# Patient Record
Sex: Female | Born: 1935
Health system: Southern US, Community
[De-identification: ages and names within clinical notes are randomized; demographics above are authoritative.]

## PROBLEM LIST (undated history)

## (undated) DIAGNOSIS — F0390 Unspecified dementia without behavioral disturbance: Secondary | ICD-10-CM

## (undated) DIAGNOSIS — C801 Malignant (primary) neoplasm, unspecified: Secondary | ICD-10-CM

## (undated) DIAGNOSIS — I10 Essential (primary) hypertension: Secondary | ICD-10-CM

## (undated) DIAGNOSIS — I639 Cerebral infarction, unspecified: Secondary | ICD-10-CM

## (undated) DIAGNOSIS — E7800 Pure hypercholesterolemia, unspecified: Secondary | ICD-10-CM

## (undated) DIAGNOSIS — G309 Alzheimer's disease, unspecified: Secondary | ICD-10-CM

## (undated) DIAGNOSIS — F0280 Dementia in other diseases classified elsewhere without behavioral disturbance: Secondary | ICD-10-CM

## (undated) HISTORY — PX: ABDOMINAL HYSTERECTOMY: SHX81

## (undated) HISTORY — PX: BUNIONECTOMY: SHX129

## (undated) HISTORY — PX: TUMOR REMOVAL: SHX12

## (undated) HISTORY — PX: BREAST RECONSTRUCTION: SHX9

## (undated) HISTORY — PX: MASTECTOMY: SHX3

## (undated) HISTORY — PX: EYE SURGERY: SHX253

---

## 1980-02-14 DIAGNOSIS — C801 Malignant (primary) neoplasm, unspecified: Secondary | ICD-10-CM

## 1980-02-14 HISTORY — DX: Malignant (primary) neoplasm, unspecified: C80.1

## 2013-03-31 ENCOUNTER — Ambulatory Visit: Payer: Self-pay | Admitting: Family Medicine

## 2013-05-26 ENCOUNTER — Other Ambulatory Visit (HOSPITAL_COMMUNITY): Payer: Self-pay | Admitting: Internal Medicine

## 2013-05-26 DIAGNOSIS — Z1231 Encounter for screening mammogram for malignant neoplasm of breast: Secondary | ICD-10-CM

## 2013-06-02 ENCOUNTER — Ambulatory Visit (HOSPITAL_COMMUNITY): Payer: Self-pay

## 2013-06-02 ENCOUNTER — Ambulatory Visit (HOSPITAL_COMMUNITY)
Admission: RE | Admit: 2013-06-02 | Discharge: 2013-06-02 | Disposition: A | Discharge status: Home / Self Care | Payer: Medicare Other | Source: Ambulatory Visit | Attending: Internal Medicine | Admitting: Internal Medicine

## 2013-06-02 DIAGNOSIS — Z1231 Encounter for screening mammogram for malignant neoplasm of breast: Secondary | ICD-10-CM

## 2014-10-26 ENCOUNTER — Ambulatory Visit (HOSPITAL_COMMUNITY)
Admission: RE | Admit: 2014-10-26 | Discharge: 2014-10-26 | Disposition: A | Discharge status: Home / Self Care | Payer: Medicare Other | Source: Ambulatory Visit | Attending: Internal Medicine | Admitting: Internal Medicine

## 2014-10-26 ENCOUNTER — Encounter (HOSPITAL_COMMUNITY): Payer: Self-pay

## 2014-10-26 DIAGNOSIS — M810 Age-related osteoporosis without current pathological fracture: Secondary | ICD-10-CM | POA: Insufficient documentation

## 2014-10-26 HISTORY — DX: Pure hypercholesterolemia, unspecified: E78.00

## 2014-10-26 HISTORY — DX: Malignant (primary) neoplasm, unspecified: C80.1

## 2014-10-26 HISTORY — DX: Cerebral infarction, unspecified: I63.9

## 2014-10-26 HISTORY — DX: Dementia in other diseases classified elsewhere, unspecified severity, without behavioral disturbance, psychotic disturbance, mood disturbance, and anxiety: F02.80

## 2014-10-26 HISTORY — DX: Alzheimer's disease, unspecified: G30.9

## 2014-10-26 HISTORY — DX: Unspecified dementia, unspecified severity, without behavioral disturbance, psychotic disturbance, mood disturbance, and anxiety: F03.90

## 2014-10-26 HISTORY — DX: Essential (primary) hypertension: I10

## 2014-10-26 MED ORDER — DENOSUMAB 60 MG/ML ~~LOC~~ SOLN
60.0000 mg | Freq: Once | SUBCUTANEOUS | Status: AC
  Administered 2014-10-26: 60 mg via SUBCUTANEOUS
  Filled 2014-10-26: qty 1

## 2014-10-26 NOTE — Discharge Instructions (Signed)
Denosumab injection What is this medicine? DENOSUMAB (den oh sue mab) slows bone breakdown. Prolia is used to treat osteoporosis in women after menopause and in men. Xgeva is used to prevent bone fractures and other bone problems caused by cancer bone metastases. Xgeva is also used to treat giant cell tumor of the bone. This medicine may be used for other purposes; ask your health care provider or pharmacist if you have questions. COMMON BRAND NAME(S): Prolia, XGEVA What should I tell my health care provider before I take this medicine? They need to know if you have any of these conditions: -dental disease -eczema -infection or history of infections -kidney disease or on dialysis -low blood calcium or vitamin D -malabsorption syndrome -scheduled to have surgery or tooth extraction -taking medicine that contains denosumab -thyroid or parathyroid disease -an unusual reaction to denosumab, other medicines, foods, dyes, or preservatives -pregnant or trying to get pregnant -breast-feeding How should I use this medicine? This medicine is for injection under the skin. It is given by a health care professional in a hospital or clinic setting. If you are getting Prolia, a special MedGuide will be given to you by the pharmacist with each prescription and refill. Be sure to read this information carefully each time. For Prolia, talk to your pediatrician regarding the use of this medicine in children. Special care may be needed. For Xgeva, talk to your pediatrician regarding the use of this medicine in children. While this drug may be prescribed for children as young as 13 years for selected conditions, precautions do apply. Overdosage: If you think you've taken too much of this medicine contact a poison control center or emergency room at once. Overdosage: If you think you have taken too much of this medicine contact a poison control center or emergency room at once. NOTE: This medicine is only for  you. Do not share this medicine with others. What if I miss a dose? It is important not to miss your dose. Call your doctor or health care professional if you are unable to keep an appointment. What may interact with this medicine? Do not take this medicine with any of the following medications: -other medicines containing denosumab This medicine may also interact with the following medications: -medicines that suppress the immune system -medicines that treat cancer -steroid medicines like prednisone or cortisone This list may not describe all possible interactions. Give your health care provider a list of all the medicines, herbs, non-prescription drugs, or dietary supplements you use. Also tell them if you smoke, drink alcohol, or use illegal drugs. Some items may interact with your medicine. What should I watch for while using this medicine? Visit your doctor or health care professional for regular checks on your progress. Your doctor or health care professional may order blood tests and other tests to see how you are doing. Call your doctor or health care professional if you get a cold or other infection while receiving this medicine. Do not treat yourself. This medicine may decrease your body's ability to fight infection. You should make sure you get enough calcium and vitamin D while you are taking this medicine, unless your doctor tells you not to. Discuss the foods you eat and the vitamins you take with your health care professional. See your dentist regularly. Brush and floss your teeth as directed. Before you have any dental work done, tell your dentist you are receiving this medicine. Do not become pregnant while taking this medicine or for 5 months after stopping   it. Women should inform their doctor if they wish to become pregnant or think they might be pregnant. There is a potential for serious side effects to an unborn child. Talk to your health care professional or pharmacist for more  information. What side effects may I notice from receiving this medicine? Side effects that you should report to your doctor or health care professional as soon as possible: -allergic reactions like skin rash, itching or hives, swelling of the face, lips, or tongue -breathing problems -chest pain -fast, irregular heartbeat -feeling faint or lightheaded, falls -fever, chills, or any other sign of infection -muscle spasms, tightening, or twitches -numbness or tingling -skin blisters or bumps, or is dry, peels, or red -slow healing or unexplained pain in the mouth or jaw -unusual bleeding or bruising Side effects that usually do not require medical attention (Report these to your doctor or health care professional if they continue or are bothersome.): -muscle pain -stomach upset, gas This list may not describe all possible side effects. Call your doctor for medical advice about side effects. You may report side effects to FDA at 1-800-FDA-1088. Where should I keep my medicine? This medicine is only given in a clinic, doctor's office, or other health care setting and will not be stored at home. NOTE: This sheet is a summary. It may not cover all possible information. If you have questions about this medicine, talk to your doctor, pharmacist, or health care provider.  2015, Elsevier/Gold Standard. (2011-07-31 12:37:47)  

## 2014-12-28 DIAGNOSIS — E782 Mixed hyperlipidemia: Secondary | ICD-10-CM | POA: Diagnosis not present

## 2014-12-28 DIAGNOSIS — I10 Essential (primary) hypertension: Secondary | ICD-10-CM | POA: Diagnosis not present

## 2015-01-04 DIAGNOSIS — G309 Alzheimer's disease, unspecified: Secondary | ICD-10-CM | POA: Diagnosis not present

## 2015-01-04 DIAGNOSIS — E782 Mixed hyperlipidemia: Secondary | ICD-10-CM | POA: Diagnosis not present

## 2015-01-04 DIAGNOSIS — K649 Unspecified hemorrhoids: Secondary | ICD-10-CM | POA: Diagnosis not present

## 2015-01-04 DIAGNOSIS — I10 Essential (primary) hypertension: Secondary | ICD-10-CM | POA: Diagnosis not present

## 2015-04-26 ENCOUNTER — Ambulatory Visit (HOSPITAL_COMMUNITY): Payer: Self-pay

## 2015-06-29 DIAGNOSIS — E782 Mixed hyperlipidemia: Secondary | ICD-10-CM | POA: Diagnosis not present

## 2015-07-05 DIAGNOSIS — I10 Essential (primary) hypertension: Secondary | ICD-10-CM | POA: Diagnosis not present

## 2015-07-05 DIAGNOSIS — E782 Mixed hyperlipidemia: Secondary | ICD-10-CM | POA: Diagnosis not present

## 2016-07-03 DIAGNOSIS — E782 Mixed hyperlipidemia: Secondary | ICD-10-CM | POA: Diagnosis not present

## 2016-07-04 DIAGNOSIS — E782 Mixed hyperlipidemia: Secondary | ICD-10-CM | POA: Diagnosis not present

## 2016-07-04 DIAGNOSIS — Z0000 Encounter for general adult medical examination without abnormal findings: Secondary | ICD-10-CM | POA: Diagnosis not present

## 2016-07-04 DIAGNOSIS — G309 Alzheimer's disease, unspecified: Secondary | ICD-10-CM | POA: Diagnosis not present

## 2016-07-04 DIAGNOSIS — I10 Essential (primary) hypertension: Secondary | ICD-10-CM | POA: Diagnosis not present

## 2017-05-28 ENCOUNTER — Inpatient Hospital Stay (HOSPITAL_COMMUNITY)
Admission: EM | Admit: 2017-05-28 | Discharge: 2017-05-30 | DRG: 641 | Disposition: A | Discharge status: Hospice | Payer: Medicare Other | Attending: Family Medicine | Admitting: Family Medicine

## 2017-05-28 ENCOUNTER — Emergency Department (HOSPITAL_COMMUNITY): Payer: Medicare Other

## 2017-05-28 ENCOUNTER — Other Ambulatory Visit: Payer: Self-pay

## 2017-05-28 ENCOUNTER — Encounter (HOSPITAL_COMMUNITY): Payer: Self-pay | Admitting: Emergency Medicine

## 2017-05-28 DIAGNOSIS — R531 Weakness: Secondary | ICD-10-CM | POA: Diagnosis not present

## 2017-05-28 DIAGNOSIS — Z79899 Other long term (current) drug therapy: Secondary | ICD-10-CM | POA: Diagnosis not present

## 2017-05-28 DIAGNOSIS — R627 Adult failure to thrive: Secondary | ICD-10-CM | POA: Diagnosis not present

## 2017-05-28 DIAGNOSIS — Z9010 Acquired absence of unspecified breast and nipple: Secondary | ICD-10-CM | POA: Diagnosis not present

## 2017-05-28 DIAGNOSIS — G8191 Hemiplegia, unspecified affecting right dominant side: Secondary | ICD-10-CM | POA: Diagnosis not present

## 2017-05-28 DIAGNOSIS — I69391 Dysphagia following cerebral infarction: Secondary | ICD-10-CM

## 2017-05-28 DIAGNOSIS — I10 Essential (primary) hypertension: Secondary | ICD-10-CM | POA: Diagnosis not present

## 2017-05-28 DIAGNOSIS — Z7982 Long term (current) use of aspirin: Secondary | ICD-10-CM

## 2017-05-28 DIAGNOSIS — Z90710 Acquired absence of both cervix and uterus: Secondary | ICD-10-CM | POA: Diagnosis not present

## 2017-05-28 DIAGNOSIS — I69351 Hemiplegia and hemiparesis following cerebral infarction affecting right dominant side: Secondary | ICD-10-CM | POA: Diagnosis not present

## 2017-05-28 DIAGNOSIS — Z853 Personal history of malignant neoplasm of breast: Secondary | ICD-10-CM | POA: Diagnosis not present

## 2017-05-28 DIAGNOSIS — Z8673 Personal history of transient ischemic attack (TIA), and cerebral infarction without residual deficits: Secondary | ICD-10-CM

## 2017-05-28 DIAGNOSIS — R404 Transient alteration of awareness: Secondary | ICD-10-CM | POA: Diagnosis not present

## 2017-05-28 DIAGNOSIS — R1310 Dysphagia, unspecified: Secondary | ICD-10-CM | POA: Diagnosis present

## 2017-05-28 DIAGNOSIS — Z515 Encounter for palliative care: Secondary | ICD-10-CM | POA: Diagnosis present

## 2017-05-28 DIAGNOSIS — Z9841 Cataract extraction status, right eye: Secondary | ICD-10-CM | POA: Diagnosis not present

## 2017-05-28 DIAGNOSIS — Z66 Do not resuscitate: Secondary | ICD-10-CM | POA: Diagnosis not present

## 2017-05-28 DIAGNOSIS — E870 Hyperosmolality and hypernatremia: Principal | ICD-10-CM | POA: Diagnosis present

## 2017-05-28 DIAGNOSIS — Z888 Allergy status to other drugs, medicaments and biological substances status: Secondary | ICD-10-CM | POA: Diagnosis not present

## 2017-05-28 DIAGNOSIS — G309 Alzheimer's disease, unspecified: Secondary | ICD-10-CM | POA: Diagnosis not present

## 2017-05-28 DIAGNOSIS — Z87891 Personal history of nicotine dependence: Secondary | ICD-10-CM | POA: Diagnosis not present

## 2017-05-28 DIAGNOSIS — E860 Dehydration: Secondary | ICD-10-CM | POA: Diagnosis not present

## 2017-05-28 DIAGNOSIS — Z7189 Other specified counseling: Secondary | ICD-10-CM

## 2017-05-28 DIAGNOSIS — E7800 Pure hypercholesterolemia, unspecified: Secondary | ICD-10-CM | POA: Diagnosis present

## 2017-05-28 DIAGNOSIS — Z9842 Cataract extraction status, left eye: Secondary | ICD-10-CM | POA: Diagnosis not present

## 2017-05-28 DIAGNOSIS — Z9011 Acquired absence of right breast and nipple: Secondary | ICD-10-CM | POA: Diagnosis not present

## 2017-05-28 DIAGNOSIS — F0280 Dementia in other diseases classified elsewhere without behavioral disturbance: Secondary | ICD-10-CM | POA: Diagnosis present

## 2017-05-28 LAB — URINALYSIS, ROUTINE W REFLEX MICROSCOPIC
BILIRUBIN URINE: NEGATIVE
Glucose, UA: NEGATIVE mg/dL
KETONES UR: 20 mg/dL — AB
Leukocytes, UA: NEGATIVE
Nitrite: NEGATIVE
Protein, ur: NEGATIVE mg/dL
SPECIFIC GRAVITY, URINE: 1.024 (ref 1.005–1.030)
pH: 5 (ref 5.0–8.0)

## 2017-05-28 LAB — CBC WITH DIFFERENTIAL/PLATELET
BASOS ABS: 0 10*3/uL (ref 0.0–0.1)
Basophils Relative: 0 %
Eosinophils Absolute: 0 10*3/uL (ref 0.0–0.7)
Eosinophils Relative: 0 %
HEMATOCRIT: 50.7 % — AB (ref 36.0–46.0)
HEMOGLOBIN: 14.8 g/dL (ref 12.0–15.0)
LYMPHS PCT: 22 %
Lymphs Abs: 1.8 10*3/uL (ref 0.7–4.0)
MCH: 30 pg (ref 26.0–34.0)
MCHC: 29.2 g/dL — ABNORMAL LOW (ref 30.0–36.0)
MCV: 102.6 fL — AB (ref 78.0–100.0)
Monocytes Absolute: 0.6 10*3/uL (ref 0.1–1.0)
Monocytes Relative: 7 %
NEUTROS ABS: 5.8 10*3/uL (ref 1.7–7.7)
Neutrophils Relative %: 71 %
Platelets: 148 10*3/uL — ABNORMAL LOW (ref 150–400)
RBC: 4.94 MIL/uL (ref 3.87–5.11)
RDW: 15.3 % (ref 11.5–15.5)
WBC: 8.2 10*3/uL (ref 4.0–10.5)

## 2017-05-28 LAB — COMPREHENSIVE METABOLIC PANEL
ALK PHOS: 92 U/L (ref 38–126)
ALT: 53 U/L (ref 14–54)
AST: 48 U/L — AB (ref 15–41)
Albumin: 3.6 g/dL (ref 3.5–5.0)
Anion gap: 11 (ref 5–15)
BILIRUBIN TOTAL: 0.8 mg/dL (ref 0.3–1.2)
BUN: 50 mg/dL — ABNORMAL HIGH (ref 6–20)
CHLORIDE: 129 mmol/L — AB (ref 101–111)
CO2: 26 mmol/L (ref 22–32)
CREATININE: 1.12 mg/dL — AB (ref 0.44–1.00)
Calcium: 9.4 mg/dL (ref 8.9–10.3)
GFR calc Af Amer: 52 mL/min — ABNORMAL LOW (ref >60)
GFR, EST NON AFRICAN AMERICAN: 45 mL/min — AB (ref >60)
Glucose, Bld: 111 mg/dL — ABNORMAL HIGH (ref 65–99)
Potassium: 3.6 mmol/L (ref 3.5–5.1)
Sodium: 166 mmol/L (ref 135–145)
Total Protein: 7.3 g/dL (ref 6.5–8.1)

## 2017-05-28 LAB — TROPONIN I

## 2017-05-28 MED ORDER — MORPHINE SULFATE (CONCENTRATE) 10 MG/0.5ML PO SOLN: 5.0000 mg | ORAL | Status: DC | PRN

## 2017-05-28 MED ORDER — LORAZEPAM 2 MG/ML IJ SOLN: 1.0000 mg | INTRAMUSCULAR | Status: DC | PRN

## 2017-05-28 MED ORDER — SODIUM CHLORIDE 0.9% FLUSH: 3.0000 mL | INTRAVENOUS | Status: DC | PRN

## 2017-05-28 MED ORDER — LORAZEPAM 2 MG/ML PO CONC: 1.0000 mg | ORAL | Status: DC | PRN

## 2017-05-28 MED ORDER — SODIUM CHLORIDE 0.9% FLUSH
3.0000 mL | Freq: Two times a day (BID) | INTRAVENOUS | Status: DC
  Administered 2017-05-28: 3 mL via INTRAVENOUS

## 2017-05-28 MED ORDER — GLYCOPYRROLATE 0.2 MG/ML IJ SOLN: 0.2000 mg | INTRAMUSCULAR | Status: DC | PRN

## 2017-05-28 MED ORDER — ACETAMINOPHEN 325 MG PO TABS: 650.0000 mg | ORAL_TABLET | Freq: Four times a day (QID) | ORAL | Status: DC | PRN

## 2017-05-28 MED ORDER — LORAZEPAM 1 MG PO TABS: 1.0000 mg | ORAL_TABLET | ORAL | Status: DC | PRN

## 2017-05-28 MED ORDER — HALOPERIDOL LACTATE 5 MG/ML IJ SOLN: 0.5000 mg | INTRAMUSCULAR | Status: DC | PRN

## 2017-05-28 MED ORDER — POLYVINYL ALCOHOL 1.4 % OP SOLN: 1.0000 [drp] | Freq: Four times a day (QID) | OPHTHALMIC | Status: DC | PRN

## 2017-05-28 MED ORDER — HALOPERIDOL 0.5 MG PO TABS: 0.5000 mg | ORAL_TABLET | ORAL | Status: DC | PRN

## 2017-05-28 MED ORDER — SODIUM CHLORIDE 0.9 % IV SOLN
INTRAVENOUS | Status: DC
  Administered 2017-05-28: 16:00:00 via INTRAVENOUS

## 2017-05-28 MED ORDER — ACETAMINOPHEN 650 MG RE SUPP: 650.0000 mg | Freq: Four times a day (QID) | RECTAL | Status: DC | PRN

## 2017-05-28 MED ORDER — GLYCOPYRROLATE 1 MG PO TABS: 1.0000 mg | ORAL_TABLET | ORAL | Status: DC | PRN

## 2017-05-28 MED ORDER — HALOPERIDOL LACTATE 2 MG/ML PO CONC
0.5000 mg | ORAL | Status: DC | PRN
  Filled 2017-05-28: qty 0.3

## 2017-05-28 MED ORDER — BIOTENE DRY MOUTH MT LIQD: 15.0000 mL | OROMUCOSAL | Status: DC | PRN

## 2017-05-28 MED ORDER — ONDANSETRON HCL 4 MG/2ML IJ SOLN: 4.0000 mg | Freq: Four times a day (QID) | INTRAMUSCULAR | Status: DC | PRN

## 2017-05-28 MED ORDER — ONDANSETRON 4 MG PO TBDP: 4.0000 mg | ORAL_TABLET | Freq: Four times a day (QID) | ORAL | Status: DC | PRN

## 2017-05-28 MED ORDER — SODIUM CHLORIDE 0.9 % IV SOLN: 250.0000 mL | INTRAVENOUS | Status: DC | PRN

## 2017-05-28 MED ORDER — ORAL CARE MOUTH RINSE
15.0000 mL | Freq: Two times a day (BID) | OROMUCOSAL | Status: DC
  Administered 2017-05-28 – 2017-05-29 (×2): 15 mL via OROMUCOSAL

## 2017-05-28 NOTE — ED Provider Notes (Signed)
[facility] EMERGENCY DEPARTMENT Provider Note   CSN: [id number] Arrival date & time: 05/28/17  1317     History   Chief Complaint Chief Complaint  Patient presents with  . Weakness    HPI Mary Dickson is a 84 y.o. female.  HPI  The patient is an 84 year old female, she has a history of Alzheimer's disease and dementia for which she is unable to give [place] much in the way of information.  She presents with altered mental status further complicating her inability to give [place] any information.  Level 5 caveat applies.  Per the paramedics who are the only historians at this time the patient is coming from home where she lives with family with worsening generalized fatigue and weakness over the last 3 weeks with some confusion.  There is no other family members to corroborate the story at this time.  In the prehospital setting they reported a normal blood sugar at 114.  Past Medical History:  Diagnosis Date  . Alzheimer disease   . Cancer [facility]) 1982   right breast  . Dementia   . High cholesterol   . Hypertension   . Stroke [facility])     There are no active problems to display for this patient.   Past Surgical History:  Procedure Laterality Date  . ABDOMINAL HYSTERECTOMY     TOTAL  . BREAST RECONSTRUCTION Right   . BUNIONECTOMY Right   . EYE SURGERY Bilateral    CATARACT REMOVALS  . MASTECTOMY Right   . TUMOR REMOVAL     LOWER BACK     OB History   None      Home Medications    Prior to Admission medications   Medication Sig Start Date End Date Taking? Authorizing Provider  acetaminophen (TYLENOL) 500 MG tablet Take 500 mg by mouth daily.    [provider]  aspirin 81 MG tablet Take 81 mg by mouth.    [provider]  atorvastatin (LIPITOR) 40 MG tablet Take 40 mg by mouth daily.    [provider]  Calcium Carbonate-Vitamin D (CALCIUM 600-D) 600-400 MG-UNIT per tablet Take 1 tablet by mouth daily.    [provider]    cholecalciferol (VITAMIN D) 1000 UNITS tablet Take 1,000 Units by mouth daily.    [provider]  denosumab (PROLIA) 60 MG/ML SOLN injection Inject 60 mg into the skin every 6 (six) months. Administer in upper arm, thigh, or abdomen    [provider]  donepezil (ARICEPT) 10 MG tablet Take 10 mg by mouth at bedtime.    [provider]  lisinopril (PRINIVIL,ZESTRIL) 20 MG tablet Take 20 mg by mouth daily.    [provider]  loratadine (CLARITIN) 10 MG tablet Take 10 mg by mouth daily.    [provider]  memantine (NAMENDA XR) 28 MG CP24 24 hr capsule Take 28 mg by mouth daily.    [provider]    Family History No family history on file.  Social History Social History   Tobacco Use  . Smoking status: Former Research scientist (life sciences)  . Smokeless tobacco: Never Used  Substance Use Topics  . Alcohol use: No  . Drug use: No     Allergies   Gabapentin   Review of Systems Review of Systems  Unable to perform ROS: Dementia     Physical Exam Updated Vital Signs BP (!) 147/99 (BP Location: Left Arm)   Pulse 86   Temp (!) 97.5 F (36.4 C) (  Oral)   Resp 16   Ht 5\' 6"  (1.676 m)   Wt 40.4 kg (89 lb)   SpO2 94%   BMI 14.36 kg/m   Physical Exam  Constitutional: She appears well-developed and well-nourished. No distress.  HENT:  Head: Normocephalic and atraumatic.  Mouth/Throat: No oropharyngeal exudate.  Dry mucous membranes present  Eyes: Pupils are equal, round, and reactive to light. Conjunctivae and EOM are normal. Right eye exhibits no discharge. Left eye exhibits no discharge. No scleral icterus.  Neck: Normal range of motion. Neck supple. No JVD present. No thyromegaly present.  Cardiovascular: Normal rate, regular rhythm, normal heart sounds and intact distal pulses. Exam reveals no gallop and no friction rub.  No murmur heard. Pulmonary/Chest: Effort normal and breath sounds normal. No respiratory distress. She has no  wheezes. She has no rales.  Abdominal: Soft. Bowel sounds are normal. She exhibits no distension and no mass. There is no tenderness.  Musculoskeletal: Normal range of motion. She exhibits no edema or tenderness.  Lymphadenopathy:    She has no cervical adenopathy.  Neurological: She is alert. Coordination normal.  The patient is able to lift her left leg, grip with her left hand, she is able to open and close her eyes but does not answer questions when asked.  She has right-sided facial droop, right arm flaccid paralysis and right leg generalized weakness as well.  Skin: Skin is warm and dry. No rash noted. No erythema.  Psychiatric: She has a normal mood and affect. Her behavior is normal.  Nursing note and vitals reviewed.    ED Treatments / Results  Labs (all labs ordered are listed, but only abnormal results are displayed) Labs Reviewed  URINE CULTURE  CBC WITH DIFFERENTIAL/PLATELET  COMPREHENSIVE METABOLIC PANEL  URINALYSIS, ROUTINE W REFLEX MICROSCOPIC  TROPONIN I    EKG EKG Interpretation  Date/Time:  Monday May 28 2017 13:24:55 EDT Ventricular Rate:  86 PR Interval:    QRS Duration: 115 QT Interval:  402 QTC Calculation: 481 R Axis:   -65 Text Interpretation:  Sinus rhythm Incomplete left bundle branch block LVH with secondary repolarization abnormality No old tracing to compare Abnormal ekg Confirmed by Noemi Chapel [phone]) on 05/28/2017 1:34:22 PM   Radiology No results found.  Procedures .Critical Care Performed by: Noemi Chapel, MD Authorized by: Noemi Chapel, MD   Critical care provider statement:    Critical care time (minutes):  35   Critical care time was exclusive of:  Separately billable procedures and treating other patients and teaching time   Critical care was necessary to treat or prevent imminent or life-threatening deterioration of the following conditions:  Dehydration (electrolyte abnormalities)   Critical care was time spent personally  by me on the following activities:  Blood draw for specimens, development of treatment plan with patient or surrogate, discussions with consultants, evaluation of patient's response to treatment, examination of patient, obtaining history from patient or surrogate, ordering and performing treatments and interventions, ordering and review of laboratory studies, ordering and review of radiographic studies, pulse oximetry, re-evaluation of patient's condition and review of old charts   (including critical care time)  Medications Ordered in ED Medications - No data to display   Initial Impression / Assessment and Plan / ED Course  I have reviewed the triage vital signs and the nursing notes.  Pertinent labs & imaging results that were available during my care of the patient were reviewed by me and considered in my medical decision making (see  chart for details).  Clinical Course as of May 29 1534  Mon May 28, 2017  1455 Sodium(!!): 166 [BM]  1455 Creatinine(!): 1.12 [BM]  1455 BUN(!): 50 [BM]  1455 The patient has hyperchloremic and hypernatremic consistent with a dehydrated state.  The urinalysis does show ketones as well as a high specific gravity, no obvious infection.  The patient will be given some IV fluids, she will need to be admitted to the hospital.  I do not see any signs of infection at this time.  I have personally viewed her chest x-ray and find no signs of infiltrate.  Chloride(!): 129 [BM]    Clinical Course User Index [BM] Noemi Chapel, MD   The patient's exam is not suggestive of any focal process.  There is no family to corroborate story or to give her baseline.  Will pursue a generalized weakness workup with electrolytes to make sure she is not hyponatremic, hypokalemic, hypocalcemic, dehydrated have a urinary tract infection or a new stroke.  This will require blood work chest x-ray EKG and a CT scan of the brain.  EKG does show a left bundle branch block with left  ventricular hypertrophy.  There is no old EKGs to compare this to.  Labs confirm that the patient does not fact have severe hypernatremia, she will need IV fluids, gradually bring this down.  This explains her near obtunded condition.  She does not appear to be hemodynamically unstable.  I discussed the care with the hospitalist who will admit to the hospital.  [place] provided for severe Hypernatremia  Final Clinical Impressions(s) / ED Diagnoses   Final diagnoses:  Hypernatremia  Dehydration      Noemi Chapel, MD 05/28/17 1537

## 2017-05-28 NOTE — ED Notes (Signed)
CRITICAL VALUE ALERT  Critical Value na 166  Date & Time Notied:  05/28/17 1421  Provider Notified: Dr Sabra Heck  Orders Received/Actions taken: o

## 2017-05-28 NOTE — H&P (Signed)
History and Physical    Mary Dickson DDU:[id number] DOB: 07-Dec-1933 DOA: 05/28/2017  PCP: Celene Squibb, MD Patient coming from: Home  I have personally briefly reviewed patient's old medical records in [place]  Chief Complaint: Failure to thrive  HPI: Mary Dickson is a 84 y.o. female with medical history significant of Alzheimer's dementia, previous stroke with residual right-sided hemiplegia and dysphagia, lives at home with her daughter.  Her daughter reports that for the past few weeks, she is noted that the patient has been eating less.  She often refuses to eat.  She has been becoming increasingly weak.  She is sleeping more.  She is less interactive.  She is not had any fever, shortness of breath, vomiting or diarrhea.  She does occasionally cough after eating but this is not a new problem.  She normally is able to assist with transfers from bed to wheelchair, but lately she has become "dead weight".  ED Course: When evaluated in the emergency room, vitals were noted to be stable, she was found to have a serum sodium of 166, chloride of 129, BUN of 50, she appeared to be clinically dehydrated.  Urinalysis did not show any signs of infection.  Chest x-ray did not show any signs of pneumonia.  CT scan of the head was also unremarkable.  She is been referred for admission.  Review of Systems: As per HPI otherwise 10 point review of systems negative.    Past Medical History:  Diagnosis Date  . Alzheimer disease   . Cancer [facility]) 1982   right breast  . Dementia   . High cholesterol   . Hypertension   . Stroke [facility])     Past Surgical History:  Procedure Laterality Date  . ABDOMINAL HYSTERECTOMY     TOTAL  . BREAST RECONSTRUCTION Right   . BUNIONECTOMY Right   . EYE SURGERY Bilateral    CATARACT REMOVALS  . MASTECTOMY Right   . TUMOR REMOVAL     LOWER BACK     reports that she has quit smoking. She has never used smokeless tobacco. She reports that she does not drink  alcohol or use drugs.  Allergies  Allergen Reactions  . Gabapentin Other (See Comments)    Patient's daughter states delirium     Family history: Family history reviewed and not pertinent  Prior to Admission medications   Medication Sig Start Date End Date Taking? Authorizing Provider  acetaminophen (TYLENOL) 500 MG tablet Take 500 mg by mouth daily.   Yes [provider]  aspirin 81 MG tablet Take 81 mg by mouth.   Yes [provider]  Calcium Carbonate-Vitamin D (CALCIUM 600-D) 600-400 MG-UNIT per tablet Take 1 tablet by mouth daily.   Yes [provider]  cetirizine (ZYRTEC) 10 MG tablet Take 10 mg by mouth daily.   Yes [provider]  cholecalciferol (VITAMIN D) 1000 UNITS tablet Take 1,000 Units by mouth daily.   Yes [provider]  donepezil (ARICEPT) 10 MG tablet Take 10 mg by mouth at bedtime.    [provider]  memantine (NAMENDA XR) 28 MG CP24 24 hr capsule Take 28 mg by mouth daily.    [provider]    Physical Exam: Vitals:   05/28/17 1321 05/28/17 1400 05/28/17 1530 05/28/17 1647  BP: (!) 147/99 132/81 126/90 135/84  Pulse: 86 83 88 85  Resp: 16 17 20 16   Temp: (!) 97.5 F (36.4 C)   (!) 97.4 F (  36.3 C)  TempSrc: Oral   Oral  SpO2: 94% 92% 92% 96%  Weight: 40.4 kg (89 lb)   43.8 kg (96 lb 9 oz)  Height: 5\' 6"  (1.676 m)   5\' 6"  (1.676 m)    Constitutional: NAD, calm, comfortable Vitals:   05/28/17 1321 05/28/17 1400 05/28/17 1530 05/28/17 1647  BP: (!) 147/99 132/81 126/90 135/84  Pulse: 86 83 88 85  Resp: 16 17 20 16   Temp: (!) 97.5 F (36.4 C)   (!) 97.4 F (36.3 C)  TempSrc: Oral   Oral  SpO2: 94% 92% 92% 96%  Weight: 40.4 kg (89 lb)   43.8 kg (96 lb 9 oz)  Height: 5\' 6"  (1.676 m)   5\' 6"  (1.676 m)   Eyes: PERRL, lids and conjunctivae normal ENMT: Mucous membranes are dry. Posterior pharynx clear of any exudate or lesions.Normal dentition.  Neck: normal, supple, no masses, no  thyromegaly Respiratory: clear to auscultation bilaterally, no wheezing, no crackles. Normal respiratory effort. No accessory muscle use.  Cardiovascular: Regular rate and rhythm, no murmurs / rubs / gallops. No extremity edema. 2+ pedal pulses. No carotid bruits.  Abdomen: no tenderness, no masses palpated. No hepatosplenomegaly. Bowel sounds positive.  Musculoskeletal: no clubbing / cyanosis. No joint deformity upper and lower extremities. Good ROM, no contractures. Normal muscle tone.  Skin: no rashes, lesions, ulcers. No induration Neurologic: Limited exam due to patient's mental status.  Right-sided hemiplegia present. Psychiatric: Patient is nonverbal    Labs on Admission: I have personally reviewed following labs and imaging studies  CBC: Recent Labs  Lab 05/28/17 1337  WBC 8.2  NEUTROABS 5.8  HGB 14.8  HCT 50.7*  MCV 102.6*  PLT 283*   Basic Metabolic Panel: Recent Labs  Lab 05/28/17 1337  NA 166*  K 3.6  CL 129*  CO2 26  GLUCOSE 111*  BUN 50*  CREATININE 1.12*  CALCIUM 9.4   GFR: Estimated Creatinine Clearance: 27.2 mL/min (A) (by C-G formula based on SCr of 1.12 mg/dL (H)). Liver Function Tests: Recent Labs  Lab 05/28/17 1337  AST 48*  ALT 53  ALKPHOS 92  BILITOT 0.8  PROT 7.3  ALBUMIN 3.6   No results for input(s): LIPASE, AMYLASE in the last 168 hours. No results for input(s): AMMONIA in the last 168 hours. Coagulation Profile: No results for input(s): INR, PROTIME in the last 168 hours. Cardiac Enzymes: Recent Labs  Lab 05/28/17 1337  TROPONINI <0.03   BNP (last 3 results) No results for input(s): PROBNP in the last 8760 hours. HbA1C: No results for input(s): HGBA1C in the last 72 hours. CBG: No results for input(s): GLUCAP in the last 168 hours. Lipid Profile: No results for input(s): CHOL, HDL, LDLCALC, TRIG, CHOLHDL, LDLDIRECT in the last 72 hours. Thyroid Function Tests: No results for input(s): TSH, T4TOTAL, FREET4, T3FREE,  THYROIDAB in the last 72 hours. Anemia Panel: No results for input(s): VITAMINB12, FOLATE, FERRITIN, TIBC, IRON, RETICCTPCT in the last 72 hours. Urine analysis:    Component Value Date/Time   COLORURINE YELLOW 05/28/2017 1352   APPEARANCEUR HAZY (A) 05/28/2017 1352   LABSPEC 1.024 05/28/2017 1352   PHURINE 5.0 05/28/2017 1352   GLUCOSEU NEGATIVE 05/28/2017 1352   HGBUR MODERATE (A) 05/28/2017 1352   BILIRUBINUR NEGATIVE 05/28/2017 1352   KETONESUR 20 (A) 05/28/2017 1352   PROTEINUR NEGATIVE 05/28/2017 1352   NITRITE NEGATIVE 05/28/2017 1352   LEUKOCYTESUR NEGATIVE 05/28/2017 1352    Radiological Exams on Admission: Ct Head Wo Contrast  Result Date: 05/28/2017 CLINICAL DATA:  84 year old female with generalized weakness and decreased appetite for the past 3 weeks. Prior stroke with residual right-sided weakness. Initial encounter. EXAM: CT HEAD WITHOUT CONTRAST TECHNIQUE: Contiguous axial images were obtained from the base of the skull through the vertex without intravenous contrast. COMPARISON:  None. FINDINGS: Brain: No intracranial hemorrhage or CT evidence of large acute infarct. Remote infarct posterior left internal capsule, right external capsule and inferior right cerebellum. Prominent chronic microvascular changes. Global atrophy. No intracranial mass lesion noted on this unenhanced exam. Vascular: Vascular calcifications Skull: No acute abnormality. Sinuses/Orbits: Visualized orbital structures without acute abnormality. Visualized paranasal sinuses clear. Other: Mastoid air cells and middle ear cavities are clear. IMPRESSION: No intracranial hemorrhage or CT evidence of large acute infarct. Remote infarct posterior left internal capsule, right external capsule and inferior right cerebellum. Significant chronic microvascular changes. Atrophy. Electronically Signed   By: Genia Del M.D.   On: 05/28/2017 16:28   Dg Chest Port 1 View  Result Date: 05/28/2017 CLINICAL DATA:   Altered mental status, weakness EXAM: PORTABLE CHEST 1 VIEW COMPARISON:  None. FINDINGS: The heart size and mediastinal contours are within normal limits. Both lungs are clear. The visualized skeletal structures are unremarkable. IMPRESSION: No active disease. Electronically Signed   By: Franchot Gallo M.D.   On: 05/28/2017 14:03    EKG: Independently reviewed.  Sinus rhythm with incomplete left bundle  Assessment/Plan Active Problems:   Hypernatremia   Failure to thrive in adult   History of stroke   Right hemiplegia (HCC)   Dysphagia as late effect of stroke   Alzheimer's dementia   Dehydration    1. Hyponatremia.  Related to dehydration. 2. Advanced Alzheimer's dementia with progression leading to decreased p.o. intake and failure to thrive.  Patient's p.o. intake has been poor for several weeks now.  Her daughter has noted a rapid decline over the past few weeks.  Patient has not wanted to eat or drink anything.  I suspect that the patient is in the drying process.  I have discussed this with her daughter who seems to understand.  I have recommended hospice services and the daughter is in agreement with this.  She is unsure whether she wants the patient to return home or go to residential hospice.  She would like some more information on the services.  We will consult palliative care to discuss this further with the daughter and her husband.  I have advised against IV fluids and daughter agrees.  Will focus her care on symptom management, comfort and dignity. 3. Previous stroke with residual right-sided hemiplegia.  DVT prophylaxis: No DVT prophylaxis, comfort measures only Code Status: DNR Family Communication: Discussed with daughter at the bedside Disposition Plan: Family deciding if they wish patient to go to residential hospice Consults called: Palliative care Admission status: inpatient, medsurg   Kathie Dike MD [facility] Pager [phone]  If 7PM-7AM, please  contact night-coverage [email or web address] Password [facility]  05/28/2017, 6:39 PM

## 2017-05-28 NOTE — ED Triage Notes (Signed)
Generalized weakness and decreased appetite x 3 weeks.  cbg 114.  Pt is awake and alert but confused which is her normal.   Hx of stroke with rt sided weakness.

## 2017-05-29 ENCOUNTER — Encounter (HOSPITAL_COMMUNITY): Payer: Self-pay | Admitting: Primary Care

## 2017-05-29 DIAGNOSIS — G309 Alzheimer's disease, unspecified: Secondary | ICD-10-CM

## 2017-05-29 DIAGNOSIS — R627 Adult failure to thrive: Secondary | ICD-10-CM

## 2017-05-29 DIAGNOSIS — Z515 Encounter for palliative care: Secondary | ICD-10-CM

## 2017-05-29 DIAGNOSIS — F0280 Dementia in other diseases classified elsewhere without behavioral disturbance: Secondary | ICD-10-CM

## 2017-05-29 DIAGNOSIS — E870 Hyperosmolality and hypernatremia: Principal | ICD-10-CM

## 2017-05-29 DIAGNOSIS — Z7189 Other specified counseling: Secondary | ICD-10-CM

## 2017-05-29 MED ORDER — BISACODYL 10 MG RE SUPP
10.0000 mg | Freq: Every day | RECTAL | Status: DC | PRN
  Filled 2017-05-29: qty 1

## 2017-05-29 MED ORDER — MORPHINE SULFATE (CONCENTRATE) 10 MG/0.5ML PO SOLN: 2.5000 mg | ORAL | Status: DC | PRN

## 2017-05-29 NOTE — Progress Notes (Signed)
Patient resting in bed.  Mouth moisturizer and swabs provided to daughter.  Patient ate small amount of lunch and took a few sips.  Family at bedside.

## 2017-05-29 NOTE — Progress Notes (Signed)
PROGRESS NOTE    Mary Dickson  YIR:[id number]  DOB: [birth year]  DOA: 05/28/2017 PCP: Celene Squibb, MD   Brief Admission Hx: Mary Dickson is a 84 y.o. female with medical history significant of Alzheimer's dementia, previous stroke with residual right-sided hemiplegia and dysphagia, lives at home with her daughter.  Her daughter reports that for the past few weeks, she is noted that the patient has been eating less.  She often refuses to eat.  She has been becoming increasingly weak.  She is sleeping more.  She is less interactive.  She is not had any fever, shortness of breath, vomiting or diarrhea.  She does occasionally cough after eating but this is not a new problem.  She normally is able to assist with transfers from bed to wheelchair, but lately she has become "dead weight" per family.  MDM/Assessment & Plan:   1. Hypernatremia.  Related to dehydration from poor oral intake. Pt refusing to eat/drink anything.   2. Advanced Alzheimer's dementia with progression leading to decreased p.o. intake and failure to thrive.  Patient's p.o. intake has been poor for several weeks now.  Her daughter has noted a rapid decline over the past few weeks.  Patient has not wanted to eat or drink anything.  The patient is in the drying process.  I have discussed this with her daughter who seems to understand.  I have recommended hospice services and the daughter is in agreement with this.  She is unsure whether she wants the patient to return home or go to residential hospice.  She would like some more information on the services.  We will consult palliative care to discuss this further with the daughter and her husband.  I have advised against IV fluids and daughter agrees.  Will focus her care on symptom management, comfort and dignity. 3. Previous stroke with residual right-sided hemiplegia.  DVT prophylaxis: No DVT prophylaxis, comfort measures only Code Status: DNR Family Communication: Discussed with  daughter at the bedside Disposition Plan: Family deciding if they wish patient to go to residential hospice Consults called: Palliative care Admission status: inpatient, medsurg  Consultants:  Palliative medicine  Subjective: Pt resting comfortably, no complaints, still refusing to eat/drink.   Objective: Vitals:   05/28/17 1400 05/28/17 1530 05/28/17 1647 05/29/17 0457  BP: 132/81 126/90 135/84 119/84  Pulse: 83 88 85 89  Resp: 17 20 16    Temp:   (!) 97.4 F (36.3 C) 99.5 F (37.5 C)  TempSrc:   Oral Oral  SpO2: 92% 92% 96% 94%  Weight:   43.8 kg (96 lb 9 oz)   Height:   5\' 6"  (1.676 m)     Intake/Output Summary (Last 24 hours) at 05/29/2017 1128 Last data filed at 05/28/2017 1844 Gross per 24 hour  Intake 658.33 ml  Output 30 ml  Net 628.33 ml   Filed Weights   05/28/17 1321 05/28/17 1647  Weight: 40.4 kg (89 lb) 43.8 kg (96 lb 9 oz)     REVIEW OF SYSTEMS  As per history otherwise all reviewed and reported negative  Exam:  General exam: elderly female, appears comfortable, NAD. Dry MM.  Respiratory system: Clear. No increased work of breathing. Cardiovascular system: S1 & S2 heard.   No JVD, murmurs, gallops, clicks or pedal edema. Gastrointestinal system: Abdomen is nondistended, soft and nontender. Normal bowel sounds heard. Central nervous system:  Limited exam. Right sided hemiplegia.  Extremities: no CCE.  Data Reviewed: Basic Metabolic Panel: Recent Labs  Lab 05/28/17 1337  NA 166*  K 3.6  CL 129*  CO2 26  GLUCOSE 111*  BUN 50*  CREATININE 1.12*  CALCIUM 9.4   Liver Function Tests: Recent Labs  Lab 05/28/17 1337  AST 48*  ALT 53  ALKPHOS 92  BILITOT 0.8  PROT 7.3  ALBUMIN 3.6   No results for input(s): LIPASE, AMYLASE in the last 168 hours. No results for input(s): AMMONIA in the last 168 hours. CBC: Recent Labs  Lab 05/28/17 1337  WBC 8.2  NEUTROABS 5.8  HGB 14.8  HCT 50.7*  MCV 102.6*  PLT 148*   Cardiac  Enzymes: Recent Labs  Lab 05/28/17 1337  TROPONINI <0.03   CBG (last 3)  No results for input(s): GLUCAP in the last 72 hours. No results found for this or any previous visit (from the past 240 hour(s)).   Studies: Ct Head Wo Contrast  Result Date: 05/28/2017 CLINICAL DATA:  84 year old female with generalized weakness and decreased appetite for the past 3 weeks. Prior stroke with residual right-sided weakness. Initial encounter. EXAM: CT HEAD WITHOUT CONTRAST TECHNIQUE: Contiguous axial images were obtained from the base of the skull through the vertex without intravenous contrast. COMPARISON:  None. FINDINGS: Brain: No intracranial hemorrhage or CT evidence of large acute infarct. Remote infarct posterior left internal capsule, right external capsule and inferior right cerebellum. Prominent chronic microvascular changes. Global atrophy. No intracranial mass lesion noted on this unenhanced exam. Vascular: Vascular calcifications Skull: No acute abnormality. Sinuses/Orbits: Visualized orbital structures without acute abnormality. Visualized paranasal sinuses clear. Other: Mastoid air cells and middle ear cavities are clear. IMPRESSION: No intracranial hemorrhage or CT evidence of large acute infarct. Remote infarct posterior left internal capsule, right external capsule and inferior right cerebellum. Significant chronic microvascular changes. Atrophy. Electronically Signed   By: Genia Del M.D.   On: 05/28/2017 16:28   Dg Chest Port 1 View  Result Date: 05/28/2017 CLINICAL DATA:  Altered mental status, weakness EXAM: PORTABLE CHEST 1 VIEW COMPARISON:  None. FINDINGS: The heart size and mediastinal contours are within normal limits. Both lungs are clear. The visualized skeletal structures are unremarkable. IMPRESSION: No active disease. Electronically Signed   By: Franchot Gallo M.D.   On: 05/28/2017 14:03   Scheduled Meds: . mouth rinse  15 mL Mouth Rinse BID   Continuous  Infusions:  Active Problems:   Hypernatremia   Failure to thrive in adult   History of stroke   Right hemiplegia (HCC)   Dysphagia as late effect of stroke   Alzheimer's dementia   Dehydration  Time spent:   Irwin Brakeman, MD, FAAFP [facility] Pager [phone]  If 7PM-7AM, please contact night-coverage [email or web address] Password [email or web address] 05/29/2017, 11:28 AM    LOS: 1 day

## 2017-05-29 NOTE — Clinical Social Work Note (Signed)
Per palliative care NP request a referral was made to [facility] home.      Jaqwon Manfred, Clydene Pugh, LCSW

## 2017-05-29 NOTE — Clinical Social Work Note (Signed)
Patient Information   Patient Name Keymoni, Mccaster ([id number]) Sex Female DOB Mar 04, 1933 SSN [id number]  Room Bed  A326 A326-01  Patient Demographics   Address [address] [place] [address] Phone [phone] (Home)  Patient Ethnicity & Race   Ethnic Group Patient Race  Not Hispanic or Latino White or Caucasian  Emergency Contact(s)   Name Relation Home Work Mobile  Price,Brenda Daughter [phone]  [phone]  Documents on File    Status Date Received Description  Documents for the Patient  [place] E-Signature HIPAA Notice of Privacy Received 10/26/14   [place] E-Signature HIPAA Notice of Privacy [place] Not Received    Driver's License Received 77/41/28   Insurance Card Received 06/02/13   Advance Directives/Living Will/HCPOA/POA Received 05/28/17 advance directive  Advanced Beneficiary Notice (ABN) Not Received    Other Photo ID Not Received    Insurance Card Received 10/26/14 [employer] [id number]  Documents for the Encounter  AOB (Assignment of Insurance Benefits) Received 05/28/17 aob  E-signature AOB     MEDICARE RIGHTS Received 05/28/17 mcare rights  E-signature Medicare Rights     ED Patient Billing Extract   ED PB Billing Extract  EKG Received 05/29/17   Admission Information   Attending Provider Admitting Provider Admission Type Admission Date/Time  Murlean Iba, MD Kathie Dike, MD Emergency 05/28/17 1318  Discharge Date Hospital Service Auth/Cert Status Service Area   Internal Medicine Incomplete [facility]  Unit Room/Bed Admission Status   AP-DEPT 300 A326/A326-01 Admission (Confirmed)   Admission   Complaint  weakness  Hospital Account   Name Acct ID Class Status Primary Coverage  [place], Fiebig [id number] Inpatient [place]      Guarantor Account (for [place] [id number])   Name Relation to [place]? Acct  Type  Winferd Humphrey Self CHSA Yes Personal/Family  Address Phone    [address] [place], [place] [address] [phone])        Coverage Information (for Hospital Account [id number])   F/O Payor/Plan Precert #  [facility] [place] #  [place], [place] [id number]  [place]  [address] [address] [phone]

## 2017-05-29 NOTE — Progress Notes (Signed)
Lat entry:  Patient's daughter requested suppository.  Per order patient checked for impact, no stool upon assessment.  Daughter agreed to wait on suppository reported her mother had not been eating or drinking very much since last Friday.  Patient's external catheter changed.  Patient turned and foam sacral dressing applied.  Patient tolerated well - resting in bed.

## 2017-05-29 NOTE — Progress Notes (Signed)
Nutrition Brief Note  Patient identified on the Malnutrition Screening Tool (MST) Report  Per progression meeting, patient has end stage dementia, not eating/drinking and family is pursuing hospice/comfort care. Only descision is whether hospice will be pursued in residential or ip setting.   No nutrition interventions warranted at this time. If nutrition issues arise, please consult RD.   Burtis Junes RD, LDN, CNSC Clinical Nutrition Available Tues-Sat via Pager: [phone] 05/29/2017 10:11 AM

## 2017-05-29 NOTE — Consult Note (Signed)
Consultation Note Date: 05/29/2017   Patient Name: Mary Dickson  DOB: 07-28-1933  MRN: [id number]  Age / Sex: 84 y.o., female  PCP: Celene Squibb, MD Referring Physician: Murlean Iba, MD  Reason for Consultation: Establishing goals of care, Inpatient hospice referral and Psychosocial/spiritual support  HPI/Patient Profile: 84 y.o. female  with past medical history of Alzheimer's disease diagnosed in 2013, stroke with right hemiplegia and dysphasia 2014, high blood pressure and cholesterol, right breast cancer with mastectomy 1980 admitted on 05/28/2017 with failure to thrive hypernatremia.   Clinical Assessment and Goals of Care: I have reviewed medical records including EPIC notes, labs and imaging, received report from nursing staff, assessed the patient and then met at the bedside along with daughter, Mary Dickson, to discuss diagnosis prognosis, GOC, EOL wishes, disposition and options.  Mary Dickson is resting quietly in bed.  She does not speak to me or try to interact in any meaningful way.  She appears calm and comfortable.  I introduced Palliative Medicine as specialized medical care for people living with serious illness. It focuses on providing relief from the symptoms and stress of a serious illness.   We discussed a brief life review of the patient.  Mary Dickson divorced from her husband when her children were very small, and raise them independently per her daughter Mary Dickson.  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  As far as functional and nutritional status, Mary Dickson has had a decline over the last 6 months, in particular the last 3  weeks.  Daughter  Mary Dickson describes a PPS of 50% January 2019.  PPS reduced to 20% approximately 3 weeks ago.  Weight of 103 pounds February 4, weight of 96 pounds on 4/16.   We discussed their current illness and what it means in the larger context of their on-going co-morbidities.  Natural disease trajectory and expectations at EOL were discussed.  We review Mary Dickson's health history in detail.  I attempted to elicit values and goals of care important to the patient.  Mary Dickson has made her wishes very clear in her advanced directive.  She wishes no life prolonging measures.  Daughter Mary Dickson is in agreement for comfort measures.  The difference between aggressive medical intervention and comfort care was considered in light of the patient's goals of care.  We talked about what we can and cannot change.  We talked about the chronic illness pathway is related to dementia in detail.  We talked about what is and is not offered at residential hospice including medications focused on comfort and dignity are given, food and liquid if Mary Dickson is alert and able for intake.  No IV fluids, no antibiotics, let nature take its course.  We talked in detail  about the benefits of specialized skilled care at residential hospice.  Advanced directives, concepts specific to code status, artifical feeding and hydration, and rehospitalization were considered and discussed.  Mary Dickson has advanced directives requesting a natural death completed in May 05, 2012.  Hospice and Palliative Care services outpatient were explained and offered.  Daughter Mary Dickson is requesting comfort and dignity at end of life, transition to residential hospice with [facility].   Questions and concerns were addressed.   The family was encouraged to call with questions or concerns.   Healthcare power of attorney NEXT OF KIN -Mary Dickson is divorced, she has lived in her daughter, Mary Dickson is home for the last 4 years.  Mary Dickson has assistance  from her husband Mary Dickson.  Mary Dickson had a son Mary Dickson who committed suicide (Mary Dickson does not know this) approximately 3 years ago.  Mary Dickson has a son, Mary Dickson, who lives in [place].  Mary Dickson is high spectrum autistic.   SUMMARY OF RECOMMENDATIONS   Full comfort measures.  Comfort and dignity at end of life, transition to residential hospice with [facility].   Code Status/Advance Care Planning:  DNR  Symptom Management:   Liberalized due to comfort measures only.  Per hospice protocol upon arrival to residential hospice.  Palliative Prophylaxis:   Bowel Regimen and Turn Reposition  Additional Recommendations (Limitations, Scope, Preferences):  Full Comfort Care  Psycho-social/Spiritual:   Desire for further Chaplaincy support:no  Additional Recommendations: Caregiving  Support/Resources and Grief/Bereavement Support  Prognosis:   < 2 weeks, 1 week or less would not be surprising based on sodium of 166, no desire to eat or drink, marked decline over the last 3 weeks, family's desire to focus on comfort and dignity, let nature take its course.  Discharge Planning: Transition to [facility] residential hospice for comfort and dignity at end of life.      Primary Diagnoses: Present on Admission: . Hypernatremia . Failure to thrive in adult . Alzheimer's dementia . Dehydration   I have reviewed the medical record, interviewed the patient and family, and examined the patient. The following aspects are pertinent.  Past Medical History:  Diagnosis Date  . Alzheimer disease   . Cancer [facility]) 1982   right breast  . Dementia   . High cholesterol   . Hypertension   . Stroke [facility])    Social History   Socioeconomic History  . Marital status: Divorced    Spouse name: Not on file  . Number of children: Not on file  . Years of education: Not on file  . Highest education level: Not on file  Occupational History  . Not on file  Social Needs    . Financial resource strain: Not on file  . Food insecurity:    Worry: Not on file    Inability: Not on file  . Transportation needs:    Medical: Not on file    Non-medical: Not on file  Tobacco Use  . Smoking status: Former Research scientist (life sciences)  . Smokeless tobacco: Never Used  Substance and Sexual Activity  . Alcohol use: No  . Drug use: No  . Sexual activity: Not Currently  Lifestyle  . Physical activity:    Days per week: Not on file    Minutes per session: Not on file  . Stress: Not on file  Relationships  . Social connections:    Talks on phone: Not on file    Gets together: Not on file    Attends religious  service: Not on file    Active member of club or organization: Not on file    Attends meetings of clubs or organizations: Not on file    Relationship status: Not on file  Other Topics Concern  . Not on file  Social History Narrative  . Not on file   History reviewed. No pertinent family history. Scheduled Meds: . mouth rinse  15 mL Mouth Rinse BID   Continuous Infusions: PRN Meds:.acetaminophen **OR** acetaminophen, antiseptic oral rinse, bisacodyl, glycopyrrolate **OR** glycopyrrolate **OR** glycopyrrolate, haloperidol **OR** [DISCONTINUED] haloperidol **OR** haloperidol lactate, LORazepam **OR** [DISCONTINUED] LORazepam **OR** LORazepam, morphine, ondansetron **OR** ondansetron (ZOFRAN) IV, polyvinyl alcohol Medications Prior to Admission:  Prior to Admission medications   Medication Sig Start Date End Date Taking? Authorizing Provider  acetaminophen (TYLENOL) 500 MG tablet Take 500 mg by mouth daily.   Yes [provider]  aspirin 81 MG tablet Take 81 mg by mouth.   Yes [provider]  Calcium Carbonate-Vitamin D (CALCIUM 600-D) 600-400 MG-UNIT per tablet Take 1 tablet by mouth daily.   Yes [provider]  cetirizine (ZYRTEC) 10 MG tablet Take 10 mg by mouth daily.   Yes [provider]  cholecalciferol (VITAMIN D) 1000 UNITS tablet  Take 1,000 Units by mouth daily.   Yes [provider]  donepezil (ARICEPT) 10 MG tablet Take 10 mg by mouth at bedtime.    [provider]  memantine (NAMENDA XR) 28 MG CP24 24 hr capsule Take 28 mg by mouth daily.    [provider]   Allergies  Allergen Reactions  . Gabapentin Other (See Comments)    Patient's daughter states delirium    Review of Systems  Unable to perform ROS: Dementia    Physical Exam  Constitutional: No distress.  Will briefly open eyes and make eye contact, nonverbal, appears acutely/chronically ill  HENT:  Head: Atraumatic.  Some temporal wasting  Cardiovascular: Normal rate.  Pulmonary/Chest: Effort normal. No respiratory distress.  Abdominal: Soft. She exhibits no distension. There is no guarding.  Musculoskeletal: She exhibits no edema or deformity.  Muscle wasting  Neurological:  Known dementia, opens eyes spontaneously at times, nonverbal  Skin: Skin is dry.  Psychiatric:  Calm and cooperative, not fearful  Nursing note and vitals reviewed.   Vital Signs: BP 119/84 (BP Location: Right Arm)   Pulse 89   Temp 99.5 F (37.5 C) (Oral)   Resp 16   Ht 5' 6"  (1.676 m)   Wt 43.8 kg (96 lb 9 oz)   SpO2 94%   BMI 15.59 kg/m  Pain Scale: PAINAD   Pain Score: 0-No pain   SpO2: SpO2: 94 % O2 Device:SpO2: 94 % O2 Flow Rate: .   IO: Intake/output summary:   Intake/Output Summary (Last 24 hours) at 05/29/2017 1117 Last data filed at 05/28/2017 1844 Gross per 24 hour  Intake 658.33 ml  Output 30 ml  Net 628.33 ml    LBM: Last BM Date: 05/25/17 Baseline Weight: Weight: 40.4 kg (89 lb) Most recent weight: Weight: 43.8 kg (96 lb 9 oz)     Palliative Assessment/Data:   Flowsheet Rows     Most Recent Value  Intake Tab  Referral Department  Hospitalist  Unit at Time of Referral  Med/Surg Unit  Palliative Care Primary Diagnosis  Neurology  Date Notified  05/28/17  Palliative Care Type  New Palliative care    Reason for referral  Counsel Regarding Hospice, Clarify Goals of Care, End of Life Care  Assistance, Psychosocial or Spiritual support  Date of Admission  05/28/17  Date first seen by Palliative Care  05/29/17  # of days Palliative referral response time  1 Day(s)  # of days IP prior to Palliative referral  0  Clinical Assessment  Palliative Performance Scale Score  20%  Pain Max last 24 hours  Not able to report  Pain Min Last 24 hours  Not able to report  Dyspnea Max Last 24 Hours  Not able to report  Dyspnea Min Last 24 hours  Not able to report  Psychosocial & Spiritual Assessment  Palliative Care Outcomes  Patient/Family meeting held?  Yes  Who was at the meeting?  Daughter Mary Dickson at bedside  Palliative Care Outcomes  Clarified goals of care, Provided end of life care assistance, Transitioned to hospice, Completed durable DNR, Changed to focus on comfort, Counseled regarding hospice  Patient/Family wishes: Interventions discontinued/not started   Mechanical Ventilation, Antibiotics, Tube feedings/TPN, Vasopressors, PEG      Time In: 0910 Time Out: 1110 Time Total: 120 minutes Greater than 50%  of this time was spent counseling and coordinating care related to the above assessment and plan.  Signed by: Drue Novel, NP   Please contact Palliative Medicine Team phone at [phone] for questions and concerns.  For individual provider: See Shea Evans

## 2017-05-30 ENCOUNTER — Inpatient Hospital Stay (HOSPITAL_COMMUNITY): Admission: RE | Admit: 2017-05-30 | Source: Hospice | Admitting: Family Medicine

## 2017-05-30 ENCOUNTER — Encounter (HOSPITAL_COMMUNITY): Payer: Self-pay | Admitting: Family Medicine

## 2017-05-30 ENCOUNTER — Inpatient Hospital Stay (HOSPITAL_COMMUNITY)
Admission: RE | Admit: 2017-05-30 | Discharge: 2017-05-31 | DRG: 641 | Disposition: A | Discharge status: Hospice | Source: Ambulatory Visit | Attending: Family Medicine | Admitting: Family Medicine

## 2017-05-30 DIAGNOSIS — I10 Essential (primary) hypertension: Secondary | ICD-10-CM | POA: Diagnosis present

## 2017-05-30 DIAGNOSIS — Z9842 Cataract extraction status, left eye: Secondary | ICD-10-CM

## 2017-05-30 DIAGNOSIS — E870 Hyperosmolality and hypernatremia: Principal | ICD-10-CM | POA: Diagnosis present

## 2017-05-30 DIAGNOSIS — R627 Adult failure to thrive: Secondary | ICD-10-CM | POA: Diagnosis present

## 2017-05-30 DIAGNOSIS — Z79899 Other long term (current) drug therapy: Secondary | ICD-10-CM

## 2017-05-30 DIAGNOSIS — Z9011 Acquired absence of right breast and nipple: Secondary | ICD-10-CM

## 2017-05-30 DIAGNOSIS — Z66 Do not resuscitate: Secondary | ICD-10-CM | POA: Diagnosis present

## 2017-05-30 DIAGNOSIS — F0280 Dementia in other diseases classified elsewhere without behavioral disturbance: Secondary | ICD-10-CM | POA: Diagnosis present

## 2017-05-30 DIAGNOSIS — Z853 Personal history of malignant neoplasm of breast: Secondary | ICD-10-CM

## 2017-05-30 DIAGNOSIS — Z9841 Cataract extraction status, right eye: Secondary | ICD-10-CM

## 2017-05-30 DIAGNOSIS — E860 Dehydration: Secondary | ICD-10-CM | POA: Diagnosis present

## 2017-05-30 DIAGNOSIS — G309 Alzheimer's disease, unspecified: Secondary | ICD-10-CM | POA: Diagnosis present

## 2017-05-30 DIAGNOSIS — Z90710 Acquired absence of both cervix and uterus: Secondary | ICD-10-CM

## 2017-05-30 DIAGNOSIS — Z7982 Long term (current) use of aspirin: Secondary | ICD-10-CM

## 2017-05-30 DIAGNOSIS — Z87891 Personal history of nicotine dependence: Secondary | ICD-10-CM

## 2017-05-30 DIAGNOSIS — I69351 Hemiplegia and hemiparesis following cerebral infarction affecting right dominant side: Secondary | ICD-10-CM

## 2017-05-30 DIAGNOSIS — I69391 Dysphagia following cerebral infarction: Secondary | ICD-10-CM

## 2017-05-30 DIAGNOSIS — G8191 Hemiplegia, unspecified affecting right dominant side: Secondary | ICD-10-CM

## 2017-05-30 DIAGNOSIS — R1310 Dysphagia, unspecified: Secondary | ICD-10-CM | POA: Diagnosis present

## 2017-05-30 DIAGNOSIS — Z888 Allergy status to other drugs, medicaments and biological substances status: Secondary | ICD-10-CM

## 2017-05-30 DIAGNOSIS — Z515 Encounter for palliative care: Secondary | ICD-10-CM | POA: Diagnosis present

## 2017-05-30 MED ORDER — BIOTENE DRY MOUTH MT LIQD: 15.0000 mL | OROMUCOSAL | Status: DC | PRN

## 2017-05-30 MED ORDER — ACETAMINOPHEN 650 MG RE SUPP: 650.0000 mg | Freq: Four times a day (QID) | RECTAL | Status: DC | PRN

## 2017-05-30 MED ORDER — PHENYLEPH-SHARK LIV OIL-MO-PET 0.25-3-14-71.9 % RE OINT
TOPICAL_OINTMENT | Freq: Two times a day (BID) | RECTAL | Status: DC | PRN
  Filled 2017-05-30: qty 28

## 2017-05-30 MED ORDER — ONDANSETRON HCL 4 MG/2ML IJ SOLN: 4.0000 mg | Freq: Four times a day (QID) | INTRAMUSCULAR | Status: DC | PRN

## 2017-05-30 MED ORDER — GLYCOPYRROLATE 0.2 MG/ML IJ SOLN: 0.2000 mg | INTRAMUSCULAR | Status: DC | PRN

## 2017-05-30 MED ORDER — POLYVINYL ALCOHOL 1.4 % OP SOLN: 1.0000 [drp] | Freq: Four times a day (QID) | OPHTHALMIC | Status: DC | PRN

## 2017-05-30 MED ORDER — GLYCOPYRROLATE 1 MG PO TABS: 1.0000 mg | ORAL_TABLET | ORAL | Status: DC | PRN

## 2017-05-30 MED ORDER — MORPHINE SULFATE (CONCENTRATE) 10 MG/0.5ML PO SOLN: 5.0000 mg | ORAL | Status: DC | PRN

## 2017-05-30 MED ORDER — ONDANSETRON 4 MG PO TBDP: 4.0000 mg | ORAL_TABLET | Freq: Four times a day (QID) | ORAL | Status: DC | PRN

## 2017-05-30 MED ORDER — ZINC OXIDE 11.3 % EX CREA
1.0000 "application " | TOPICAL_CREAM | CUTANEOUS | Status: DC | PRN
  Filled 2017-05-30: qty 56

## 2017-05-30 MED ORDER — ACETAMINOPHEN 325 MG PO TABS: 650.0000 mg | ORAL_TABLET | Freq: Four times a day (QID) | ORAL | Status: DC | PRN

## 2017-05-30 NOTE — Discharge Summary (Addendum)
Physician Discharge Summary  [name] ZOX:[id number] DOB: 02-10-1934 DOA: 05/28/2017  PCP: Celene Squibb, MD  Admit date: 05/28/2017 Discharge date: 05/30/2017  Disposition: Hospice  Discharge Condition: HOSPICE   CODE STATUS: DNR    ORDERS:   SYMPTOM MANAGEMENT PER HOSPICE PROTOCOL  Brief Hospitalization Summary: Please see all hospital notes, images, labs for full details of the hospitalization. HPI: Mary Dickson is a 84 y.o. female with medical history significant of Alzheimer's dementia, previous stroke with residual right-sided hemiplegia and dysphagia, lives at home with her daughter.  Her daughter reports that for the past few weeks, she is noted that the patient has been eating less.  She often refuses to eat.  She has been becoming increasingly weak.  She is sleeping more.  She is less interactive.  She is not had any fever, shortness of breath, vomiting or diarrhea.  She does occasionally cough after eating but this is not a new problem.  She normally is able to assist with transfers from bed to wheelchair, but lately she has become "dead weight".  ED Course: When evaluated in the emergency room, vitals were noted to be stable, she was found to have a serum sodium of 166, chloride of 129, BUN of 50, she appeared to be clinically dehydrated.  Urinalysis did not show any signs of infection.  Chest x-ray did not show any signs of pneumonia.  CT scan of the head was also unremarkable.  She is been referred for admission.  MDM/Assessment & Plan:   1. Hypernatremia. Related to dehydration from poor oral intake. Pt refusing to eat/drink anything.   2. Advanced Alzheimer's dementia with progression leading to decreased p.o. intake and failure to thrive. Patient's p.o. intake has been poor for several weeks now. Her daughter has noted a rapid decline over the past few weeks. Patient has not wanted to eat or drink anything. The patient is in the drying process. I have discussed this with  her daughter who seems to understand. Family has requested residential hospice. per Education officer, museum, PATIENT IS TO DISCHARGE TO RESIDENTIAL HOSPICE.  Consulted palliative care to discuss this further with the daughter and her husband. I have advised against IV fluids and daughter agrees. Will focus her care on symptom management, comfort and dignity. 3. Previous stroke with residual right-sided hemiplegia.  DVT prophylaxis:No DVT prophylaxis, comfort measures only Code Status:DNR Family Communication:Discussed with daughter at the bedside Disposition Plan:Family deciding if they wish patient to go to residential hospice Consults called:Palliative care Admission status:inpatient, medsurg  Consultants:  Palliative medicine  Discharge Diagnoses:  Active Problems:   Hypernatremia   Failure to thrive in adult   History of stroke   Right hemiplegia ([place])   Dysphagia as late effect of stroke   Alzheimer's dementia   Dehydration   Palliative care by specialist   Goals of care, counseling/discussion   Encounter for hospice care discussion   Allergies  Allergen Reactions  . Gabapentin Other (See Comments)    Patient's daughter states delirium    Procedures/Studies: Ct Head Wo Contrast  Result Date: 05/28/2017 CLINICAL DATA:  84 year old female with generalized weakness and decreased appetite for the past 3 weeks. Prior stroke with residual right-sided weakness. Initial encounter. EXAM: CT HEAD WITHOUT CONTRAST TECHNIQUE: Contiguous axial images were obtained from the base of the skull through the vertex without intravenous contrast. COMPARISON:  None. FINDINGS: Brain: No intracranial hemorrhage or CT evidence of large acute infarct. Remote infarct posterior left internal capsule, right external capsule and  inferior right cerebellum. Prominent chronic microvascular changes. Global atrophy. No intracranial mass lesion noted on this unenhanced exam. Vascular: Vascular calcifications  Skull: No acute abnormality. Sinuses/Orbits: Visualized orbital structures without acute abnormality. Visualized paranasal sinuses clear. Other: Mastoid air cells and middle ear cavities are clear. IMPRESSION: No intracranial hemorrhage or CT evidence of large acute infarct. Remote infarct posterior left internal capsule, right external capsule and inferior right cerebellum. Significant chronic microvascular changes. Atrophy. Electronically Signed   By: Genia Del M.D.   On: 05/28/2017 16:28   Dg Chest Port 1 View  Result Date: 05/28/2017 CLINICAL DATA:  Altered mental status, weakness EXAM: PORTABLE CHEST 1 VIEW COMPARISON:  None. FINDINGS: The heart size and mediastinal contours are within normal limits. Both lungs are clear. The visualized skeletal structures are unremarkable. IMPRESSION: No active disease. Electronically Signed   By: Franchot Gallo M.D.   On: 05/28/2017 14:03      Subjective: Pt appears comfortable Discharge Exam: Vitals:   05/29/17 0457 05/30/17 0510  BP: 119/84 114/85  Pulse: 89 92  Resp:    Temp: 99.5 F (37.5 C) 98.4 F (36.9 C)  SpO2: 94% 95%   Vitals:   05/28/17 1530 05/28/17 1647 05/29/17 0457 05/30/17 0510  BP: 126/90 135/84 119/84 114/85  Pulse: 88 85 89 92  Resp: 20 16    Temp:  (!) 97.4 F (36.3 C) 99.5 F (37.5 C) 98.4 F (36.9 C)  TempSrc:  Oral Oral Oral  SpO2: 92% 96% 94% 95%  Weight:  43.8 kg (96 lb 9 oz)    Height:  5\' 6"  (1.676 m)      General: Pt appears comfortable.  Cardiovascular: RRR, S1/S2 +, no rubs, no gallops Respiratory: CTA bilaterally, no wheezing, no rhonchi Abdominal: Soft, NT, ND, bowel sounds + Extremities: no edema, no cyanosis   The results of significant diagnostics from this hospitalization (including imaging, microbiology, ancillary and laboratory) are listed below for reference.     Microbiology: Recent Results (from the past 240 hour(s))  Urine Culture     Status: Abnormal (Preliminary result)    Collection Time: 05/28/17  1:52 PM  Result Value Ref Range Status   Specimen Description   Final    URINE, CATHETERIZED Performed at [facility], [address], [place], [place] [address]    Special Requests   Final    NONE Performed at [facility], [address], [place], [place] [address]    Culture (A)  Final    80,000 COLONIES/mL UNIDENTIFIED ORGANISM Performed at [facility], [place] [address] [place], [place] [address]    Report Status PENDING  Incomplete     Labs: BNP (last 3 results) No results for input(s): BNP in the last 8760 hours. Basic Metabolic Panel: Recent Labs  Lab 05/28/17 1337  NA 166*  K 3.6  CL 129*  CO2 26  GLUCOSE 111*  BUN 50*  CREATININE 1.12*  CALCIUM 9.4   Liver Function Tests: Recent Labs  Lab 05/28/17 1337  AST 48*  ALT 53  ALKPHOS 92  BILITOT 0.8  PROT 7.3  ALBUMIN 3.6   No results for input(s): LIPASE, AMYLASE in the last 168 hours. No results for input(s): AMMONIA in the last 168 hours. CBC: Recent Labs  Lab 05/28/17 1337  WBC 8.2  NEUTROABS 5.8  HGB 14.8  HCT 50.7*  MCV 102.6*  PLT 148*   Cardiac Enzymes: Recent Labs  Lab 05/28/17 1337  TROPONINI <0.03   BNP: Invalid input(s):  POCBNP CBG: No results for input(s): GLUCAP in the last 168 hours. D-Dimer No results for input(s): DDIMER in the last 72 hours. Hgb A1c No results for input(s): HGBA1C in the last 72 hours. Lipid Profile No results for input(s): CHOL, HDL, LDLCALC, TRIG, CHOLHDL, LDLDIRECT in the last 72 hours. Thyroid function studies No results for input(s): TSH, T4TOTAL, T3FREE, THYROIDAB in the last 72 hours.  Invalid input(s): FREET3 Anemia work up No results for input(s): VITAMINB12, FOLATE, FERRITIN, TIBC, IRON, RETICCTPCT in the last 72 hours. Urinalysis    Component Value Date/Time   COLORURINE YELLOW 05/28/2017 1352   APPEARANCEUR HAZY (A) 05/28/2017 1352   LABSPEC 1.024 05/28/2017 1352   PHURINE 5.0 05/28/2017 1352    GLUCOSEU NEGATIVE 05/28/2017 1352   HGBUR MODERATE (A) 05/28/2017 1352   BILIRUBINUR NEGATIVE 05/28/2017 1352   KETONESUR 20 (A) 05/28/2017 1352   PROTEINUR NEGATIVE 05/28/2017 1352   NITRITE NEGATIVE 05/28/2017 1352   LEUKOCYTESUR NEGATIVE 05/28/2017 1352   Sepsis Labs Invalid input(s): PROCALCITONIN,  WBC,  LACTICIDVEN Microbiology Recent Results (from the past 240 hour(s))  Urine Culture     Status: Abnormal (Preliminary result)   Collection Time: 05/28/17  1:52 PM  Result Value Ref Range Status   Specimen Description   Final    URINE, CATHETERIZED Performed at [facility], [address] [place], [place] [address]    Special Requests   Final    NONE Performed at [facility], [address] [place], [place] [address]    Culture (A)  Final    80,000 COLONIES/mL UNIDENTIFIED ORGANISM Performed at [facility], [place] [address] [place],  [address]    Report Status PENDING  Incomplete    Time coordinating discharge: 34 mins  SIGNED:  Irwin Brakeman, MD  [facility] 05/30/2017, 12:04 PM Pager [phone]  If 7PM-7AM, please contact night-coverage [email or web address] Password [email or web address]

## 2017-05-30 NOTE — Care Management Important Message (Signed)
Important Message  Patient Details  Name: Mary Dickson MRN: [id number] Date of Birth: 1933/11/08   Medicare Important Message Given:  Yes    Shelda Altes 05/30/2017, 11:51 AM

## 2017-05-30 NOTE — Discharge Instructions (Signed)
SYMPTOM MANAGEMENT PER HOSPICE PROTOCOL.  ° ° °

## 2017-05-30 NOTE — H&P (Signed)
History and Physical    Mary Dickson QIH:[id number] DOB: [birth year] DOA: 05/28/2017  PCP: Celene Squibb, MD Patient coming from: Home  I have personally briefly reviewed patient's old medical records in [place]  Chief Complaint: Failure to thrive  HPI: Mary Dickson is a 84 y.o. female with medical history significant of Alzheimer's dementia, previous stroke with residual right-sided hemiplegia and dysphagia, lives at home with her daughter.  Her daughter reports that for the past few weeks, she is noted that the patient has been eating less.  She often refuses to eat.  She has been becoming increasingly weak.  She is sleeping more.  She is less interactive.  She is not had any fever, shortness of breath, vomiting or diarrhea.  She does occasionally cough after eating but this is not a new problem.  She normally is able to assist with transfers from bed to wheelchair, but lately she had become "dead weight" per family.  ED Course: When evaluated in the emergency room, vitals were noted to be stable, she was found to have a serum sodium of 166, chloride of 129, BUN of 50, she appeared to be clinically dehydrated.  Urinalysis did not show any signs of infection.  Chest x-ray did not show any signs of pneumonia.  CT scan of the head was also unremarkable.    Review of Systems: As per HPI otherwise 10 point review of systems negative.        Past Medical History:  Diagnosis Date  . Alzheimer disease   . Cancer [facility]) 1982   right breast  . Dementia   . High cholesterol   . Hypertension   . Stroke [facility])          Past Surgical History:  Procedure Laterality Date  . ABDOMINAL HYSTERECTOMY     TOTAL  . BREAST RECONSTRUCTION Right   . BUNIONECTOMY Right   . EYE SURGERY Bilateral    CATARACT REMOVALS  . MASTECTOMY Right   . TUMOR REMOVAL     LOWER BACK     reports that she has quit smoking. She has never used smokeless tobacco. She reports that  she does not drink alcohol or use drugs.       Allergies  Allergen Reactions  . Gabapentin Other (See Comments)    Patient's daughter states delirium     Family history: Family history reviewed and not pertinent         Prior to Admission medications   Medication Sig Start Date End Date Taking? Authorizing Provider  acetaminophen (TYLENOL) 500 MG tablet Take 500 mg by mouth daily.   Yes [provider]  aspirin 81 MG tablet Take 81 mg by mouth.   Yes [provider]  Calcium Carbonate-Vitamin D (CALCIUM 600-D) 600-400 MG-UNIT per tablet Take 1 tablet by mouth daily.   Yes [provider]  cetirizine (ZYRTEC) 10 MG tablet Take 10 mg by mouth daily.   Yes [provider]  cholecalciferol (VITAMIN D) 1000 UNITS tablet Take 1,000 Units by mouth daily.   Yes [provider]  donepezil (ARICEPT) 10 MG tablet Take 10 mg by mouth at bedtime.    [provider]  memantine (NAMENDA XR) 28 MG CP24 24 hr capsule Take 28 mg by mouth daily.    [provider]    Physical Exam:       Vitals:   05/28/17 1321 05/28/17 1400 05/28/17 1530 05/28/17 1647  BP: (!) 147/99 132/81 126/90  135/84  Pulse: 86 83 88 85  Resp: 16 17 20 16   Temp: (!) 97.5 F (36.4 C)   (!) 97.4 F (36.3 C)  TempSrc: Oral   Oral  SpO2: 94% 92% 92% 96%  Weight: 40.4 kg (89 lb)   43.8 kg (96 lb 9 oz)  Height: 5\' 6"  (1.676 m)   5\' 6"  (1.676 m)    Constitutional: NAD, calm, comfortable       Vitals:   05/28/17 1321 05/28/17 1400 05/28/17 1530 05/28/17 1647  BP: (!) 147/99 132/81 126/90 135/84  Pulse: 86 83 88 85  Resp: 16 17 20 16   Temp: (!) 97.5 F (36.4 C)   (!) 97.4 F (36.3 C)  TempSrc: Oral   Oral  SpO2: 94% 92% 92% 96%  Weight: 40.4 kg (89 lb)   43.8 kg (96 lb 9 oz)  Height: 5\' 6"  (1.676 m)   5\' 6"  (1.676 m)   Eyes: PERRL, lids and conjunctivae normal ENMT: Mucous membranes are dry. Posterior pharynx  clear of any exudate or lesions.Normal dentition.  Neck: normal, supple, no masses, no thyromegaly Respiratory: clear to auscultation bilaterally, no wheezing, no crackles. Normal respiratory effort. No accessory muscle use.  Cardiovascular: Regular rate and rhythm, no murmurs / rubs / gallops. No extremity edema. 2+ pedal pulses. No carotid bruits.  Abdomen: no tenderness, no masses palpated. No hepatosplenomegaly. Bowel sounds positive.  Musculoskeletal: no clubbing / cyanosis. No joint deformity upper and lower extremities. Good ROM, no contractures. Normal muscle tone.  Skin: no rashes, lesions, ulcers. No induration Neurologic: Limited exam due to patient's mental status.  Right-sided hemiplegia present. Psychiatric: Patient is nonverbal    Labs on Admission: I have personally reviewed following labs and imaging studies  CBC: LastLabs     Recent Labs  Lab 05/28/17 1337  WBC 8.2  NEUTROABS 5.8  HGB 14.8  HCT 50.7*  MCV 102.6*  PLT 148*     Basic Metabolic Panel: LastLabs  Recent Labs  Lab 05/28/17 1337  NA 166*  K 3.6  CL 129*  CO2 26  GLUCOSE 111*  BUN 50*  CREATININE 1.12*  CALCIUM 9.4     GFR: Estimated Creatinine Clearance: 27.2 mL/min (A) (by C-G formula based on SCr of 1.12 mg/dL (H)). Liver Function Tests: LastLabs  Recent Labs  Lab 05/28/17 1337  AST 48*  ALT 53  ALKPHOS 92  BILITOT 0.8  PROT 7.3  ALBUMIN 3.6     LastLabs  No results for input(s): LIPASE, AMYLASE in the last 168 hours.   LastLabs  No results for input(s): AMMONIA in the last 168 hours.   Coagulation Profile: LastLabs  No results for input(s): INR, PROTIME in the last 168 hours.   Cardiac Enzymes: LastLabs     Recent Labs  Lab 05/28/17 1337  TROPONINI <0.03     BNP (last 3 results) RecentLabs(withinlast365days)  No results for input(s): PROBNP in the last 8760 hours.   HbA1C: RecentLabs(last2labs)  No results for input(s):  HGBA1C in the last 72 hours.   CBG: LastLabs  No results for input(s): GLUCAP in the last 168 hours.   Lipid Profile: RecentLabs(last2labs)  No results for input(s): CHOL, HDL, LDLCALC, TRIG, CHOLHDL, LDLDIRECT in the last 72 hours.   Thyroid Function Tests: RecentLabs(last2labs)  No results for input(s): TSH, T4TOTAL, FREET4, T3FREE, THYROIDAB in the last 72 hours.   Anemia Panel: RecentLabs(last2labs)  No results for input(s): VITAMINB12, FOLATE, FERRITIN, TIBC, IRON, RETICCTPCT in the last 72 hours.  Urine analysis: Labs(Brief)          Component Value Date/Time   COLORURINE YELLOW 05/28/2017 1352   APPEARANCEUR HAZY (A) 05/28/2017 1352   LABSPEC 1.024 05/28/2017 1352   PHURINE 5.0 05/28/2017 1352   GLUCOSEU NEGATIVE 05/28/2017 1352   HGBUR MODERATE (A) 05/28/2017 1352   BILIRUBINUR NEGATIVE 05/28/2017 1352   KETONESUR 20 (A) 05/28/2017 1352   PROTEINUR NEGATIVE 05/28/2017 1352   NITRITE NEGATIVE 05/28/2017 1352   LEUKOCYTESUR NEGATIVE 05/28/2017 1352      Radiological Exams on Admission:  ImagingResults(Last48hours)  Ct Head Wo Contrast  Result Date: 05/28/2017 CLINICAL DATA:  84 year old female with generalized weakness and decreased appetite for the past 3 weeks. Prior stroke with residual right-sided weakness. Initial encounter. EXAM: CT HEAD WITHOUT CONTRAST TECHNIQUE: Contiguous axial images were obtained from the base of the skull through the vertex without intravenous contrast. COMPARISON:  None. FINDINGS: Brain: No intracranial hemorrhage or CT evidence of large acute infarct. Remote infarct posterior left internal capsule, right external capsule and inferior right cerebellum. Prominent chronic microvascular changes. Global atrophy. No intracranial mass lesion noted on this unenhanced exam. Vascular: Vascular calcifications Skull: No acute abnormality. Sinuses/Orbits: Visualized orbital structures without acute abnormality.  Visualized paranasal sinuses clear. Other: Mastoid air cells and middle ear cavities are clear. IMPRESSION: No intracranial hemorrhage or CT evidence of large acute infarct. Remote infarct posterior left internal capsule, right external capsule and inferior right cerebellum. Significant chronic microvascular changes. Atrophy. Electronically Signed   By: Genia Del M.D.   On: 05/28/2017 16:28   Dg Chest Port 1 View  Result Date: 05/28/2017 CLINICAL DATA:  Altered mental status, weakness EXAM: PORTABLE CHEST 1 VIEW COMPARISON:  None. FINDINGS: The heart size and mediastinal contours are within normal limits. Both lungs are clear. The visualized skeletal structures are unremarkable. IMPRESSION: No active disease. Electronically Signed   By: Franchot Gallo M.D.   On: 05/28/2017 14:03     EKG: Independently reviewed.  Sinus rhythm with incomplete left bundle  Assessment/Plan Active Problems:   Hypernatremia   Failure to thrive in adult   History of stroke   Right hemiplegia (H[facility])   Dysphagia as late effect of stroke   Alzheimer's dementia   Dehydration   1. Failure to thrive, end of life - Pt admitted to inpatient hospice care. 2. Advanced Alzheimer's dementia with progression leading to decreased p.o. intake and failure to thrive.  Patient's p.o. intake has been poor for several weeks now.  Her daughter has noted a rapid decline over the past few weeks.  Patient has not wanted to eat or drink anything.  Patient is  dying.  I have discussed this with her daughter who seems to understand.  I have recommended hospice services and the daughter is in agreement with this.    DVT prophylaxis: No DVT prophylaxis, comfort measures only Code Status: DNR Family Communication: Discussed with daughter at the bedside Disposition Plan: Family deciding if they wish patient to go to residential hospice Consults called: Palliative care Admission status: inpatient hospice  Murvin Natal  MD [facility] Pager [phone]  If 7PM-7AM, please contact night-coverage [email or web address] Password [facility]  05/28/2017, 6:39 PM

## 2017-05-31 DIAGNOSIS — R627 Adult failure to thrive: Secondary | ICD-10-CM | POA: Diagnosis not present

## 2017-05-31 DIAGNOSIS — E870 Hyperosmolality and hypernatremia: Secondary | ICD-10-CM | POA: Diagnosis not present

## 2017-05-31 DIAGNOSIS — G8191 Hemiplegia, unspecified affecting right dominant side: Secondary | ICD-10-CM | POA: Diagnosis not present

## 2017-05-31 DIAGNOSIS — G309 Alzheimer's disease, unspecified: Secondary | ICD-10-CM | POA: Diagnosis not present

## 2017-05-31 DIAGNOSIS — I69391 Dysphagia following cerebral infarction: Secondary | ICD-10-CM | POA: Diagnosis not present

## 2017-05-31 LAB — URINE CULTURE: Culture: 80000 — AB

## 2017-05-31 NOTE — Discharge Summary (Signed)
Physician Discharge Summary  Mary Dickson HFW:[id number] DOB: 04/05/1933 DOA: 05/30/2017  PCP: Celene Squibb, MD  Admit date: 05/30/2017 Discharge date: 05/31/2017  Disposition: Hospice  Discharge Condition: HOSPICE   CODE STATUS: DNR    ORDERS:   SYMPTOM MANAGEMENT PER HOSPICE PROTOCOL  Brief Hospitalization Summary: Please see all hospital notes, images, labs for full details of the hospitalization. HPI: Mary Dickson is a 84 y.o. female with medical history significant of Alzheimer's dementia, previous stroke with residual right-sided hemiplegia and dysphagia, lives at home with her daughter.  Her daughter reports that for the past few weeks, she is noted that the patient has been eating less.  She often refuses to eat.  She has been becoming increasingly weak.  She is sleeping more.  She is less interactive.  She is not had any fever, shortness of breath, vomiting or diarrhea.  She does occasionally cough after eating but this is not a new problem.  She normally is able to assist with transfers from bed to wheelchair, but lately she has become "dead weight".  ED Course: When evaluated in the emergency room, vitals were noted to be stable, she was found to have a serum sodium of 166, chloride of 129, BUN of 50, she appeared to be clinically dehydrated.  Urinalysis did not show any signs of infection.  Chest x-ray did not show any signs of pneumonia.  CT scan of the head was also unremarkable.  She is been referred for admission.  MDM/Assessment & Plan:   1. Hypernatremia. Related to dehydration from poor oral intake. Pt refusing to eat/drink anything.   2. Advanced Alzheimer's dementia with progression leading to decreased p.o. intake and failure to thrive. Patient's p.o. intake has been poor for several weeks now. Her daughter has noted a rapid decline over the past few weeks. Patient has not wanted to eat or drink anything. The patient is in the drying process. I have discussed this with  her daughter who seems to understand. Family has requested residential hospice. per Education officer, museum, PATIENT IS TO DISCHARGE TO RESIDENTIAL HOSPICE.  Consulted palliative care to discuss this further with the daughter and her husband. I have advised against IV fluids and daughter agrees. Will focus her care on symptom management, comfort and dignity. 3. Previous stroke with residual right-sided hemiplegia.  DVT prophylaxis:No DVT prophylaxis, comfort measures only Code Status:DNR Family Communication:Discussed with daughter at the bedside Disposition Plan:Family deciding if they wish patient to go to residential hospice Consults called:Palliative care Admission status:inpatient, medsurg  Consultants:  Palliative medicine  Discharge Diagnoses:  Principal Problem:   Failure to thrive in adult Active Problems:   Hypernatremia   Right hemiplegia ([place])   Dysphagia as late effect of stroke   Alzheimer's dementia  Allergies  Allergen Reactions  . Gabapentin Other (See Comments)    Patient's daughter states delirium    Procedures/Studies: Ct Head Wo Contrast  Result Date: 05/28/2017 CLINICAL DATA:  84 year old female with generalized weakness and decreased appetite for the past 3 weeks. Prior stroke with residual right-sided weakness. Initial encounter. EXAM: CT HEAD WITHOUT CONTRAST TECHNIQUE: Contiguous axial images were obtained from the base of the skull through the vertex without intravenous contrast. COMPARISON:  None. FINDINGS: Brain: No intracranial hemorrhage or CT evidence of large acute infarct. Remote infarct posterior left internal capsule, right external capsule and inferior right cerebellum. Prominent chronic microvascular changes. Global atrophy. No intracranial mass lesion noted on this unenhanced exam. Vascular: Vascular calcifications Skull: No acute abnormality. Sinuses/Orbits:  Visualized orbital structures without acute abnormality. Visualized paranasal sinuses  clear. Other: Mastoid air cells and middle ear cavities are clear. IMPRESSION: No intracranial hemorrhage or CT evidence of large acute infarct. Remote infarct posterior left internal capsule, right external capsule and inferior right cerebellum. Significant chronic microvascular changes. Atrophy. Electronically Signed   By: Genia Del M.D.   On: 05/28/2017 16:28   Dg Chest Port 1 View  Result Date: 05/28/2017 CLINICAL DATA:  Altered mental status, weakness EXAM: PORTABLE CHEST 1 VIEW COMPARISON:  None. FINDINGS: The heart size and mediastinal contours are within normal limits. Both lungs are clear. The visualized skeletal structures are unremarkable. IMPRESSION: No active disease. Electronically Signed   By: Franchot Gallo M.D.   On: 05/28/2017 14:03     Subjective: Pt appears comfortable. She is not eating well.    Discharge Exam: Vitals:   05/30/17 2019  SpO2: 93%   Vitals:   05/30/17 2019  SpO2: 93%   General: Pt appears comfortable.  Cardiovascular: RRR, S1/S2 +, no rubs, no gallops Respiratory: CTA bilaterally, no wheezing, no rhonchi Abdominal: Soft, NT, ND, bowel sounds + Extremities: no edema, no cyanosis   The results of significant diagnostics from this hospitalization (including imaging, microbiology, ancillary and laboratory) are listed below for reference.     Microbiology: Recent Results (from the past 240 hour(s))  Urine Culture     Status: Abnormal   Collection Time: 05/28/17  1:52 PM  Result Value Ref Range Status   Specimen Description   Final    URINE, CATHETERIZED Performed at [facility], [address] [place], [place] [address]    Special Requests   Final    NONE Performed at [facility], [address] [place], [place] [address]    Culture 80,000 COLONIES/mL ENTEROCOCCUS FAECALIS (A)  Final   Report Status 05/31/2017 FINAL  Final   Organism ID, Bacteria ENTEROCOCCUS FAECALIS (A)  Final      Susceptibility   Enterococcus faecalis - MIC*     AMPICILLIN <=2 SENSITIVE Sensitive     LEVOFLOXACIN 2 SENSITIVE Sensitive     NITROFURANTOIN <=16 SENSITIVE Sensitive     VANCOMYCIN 1 SENSITIVE Sensitive     * 80,000 COLONIES/mL ENTEROCOCCUS FAECALIS     Labs: BNP (last 3 results) No results for input(s): BNP in the last 8760 hours. Basic Metabolic Panel: Recent Labs  Lab 05/28/17 1337  NA 166*  K 3.6  CL 129*  CO2 26  GLUCOSE 111*  BUN 50*  CREATININE 1.12*  CALCIUM 9.4   Liver Function Tests: Recent Labs  Lab 05/28/17 1337  AST 48*  ALT 53  ALKPHOS 92  BILITOT 0.8  PROT 7.3  ALBUMIN 3.6   No results for input(s): LIPASE, AMYLASE in the last 168 hours. No results for input(s): AMMONIA in the last 168 hours. CBC: Recent Labs  Lab 05/28/17 1337  WBC 8.2  NEUTROABS 5.8  HGB 14.8  HCT 50.7*  MCV 102.6*  PLT 148*   Cardiac Enzymes: Recent Labs  Lab 05/28/17 1337  TROPONINI <0.03   BNP: Invalid input(s): POCBNP CBG: No results for input(s): GLUCAP in the last 168 hours. D-Dimer No results for input(s): DDIMER in the last 72 hours. Hgb A1c No results for input(s): HGBA1C in the last 72 hours. Lipid Profile No results for input(s): CHOL, HDL, LDLCALC, TRIG, CHOLHDL, LDLDIRECT in the last 72 hours. Thyroid function studies No results for input(s): TSH, T4TOTAL, T3FREE, THYROIDAB in the last 72 hours.  Invalid  input(s): FREET3 Anemia work up No results for input(s): VITAMINB12, FOLATE, FERRITIN, TIBC, IRON, RETICCTPCT in the last 72 hours. Urinalysis    Component Value Date/Time   COLORURINE YELLOW 05/28/2017 1352   APPEARANCEUR HAZY (A) 05/28/2017 1352   LABSPEC 1.024 05/28/2017 1352   PHURINE 5.0 05/28/2017 1352   GLUCOSEU NEGATIVE 05/28/2017 1352   HGBUR MODERATE (A) 05/28/2017 1352   BILIRUBINUR NEGATIVE 05/28/2017 1352   KETONESUR 20 (A) 05/28/2017 1352   PROTEINUR NEGATIVE 05/28/2017 1352   NITRITE NEGATIVE 05/28/2017 1352   LEUKOCYTESUR NEGATIVE 05/28/2017 1352   Sepsis Labs Invalid  input(s): PROCALCITONIN,  WBC,  LACTICIDVEN Microbiology Recent Results (from the past 240 hour(s))  Urine Culture     Status: Abnormal   Collection Time: 05/28/17  1:52 PM  Result Value Ref Range Status   Specimen Description   Final    URINE, CATHETERIZED Performed at [facility], [address], [place], [place] [address]    Special Requests   Final    NONE Performed at [facility], [address] [place], [place] [address]    Culture 80,000 COLONIES/mL ENTEROCOCCUS FAECALIS (A)  Final   Report Status 05/31/2017 FINAL  Final   Organism ID, Bacteria ENTEROCOCCUS FAECALIS (A)  Final      Susceptibility   Enterococcus faecalis - MIC*    AMPICILLIN <=2 SENSITIVE Sensitive     LEVOFLOXACIN 2 SENSITIVE Sensitive     NITROFURANTOIN <=16 SENSITIVE Sensitive     VANCOMYCIN 1 SENSITIVE Sensitive     * 80,000 COLONIES/mL ENTEROCOCCUS FAECALIS    Time coordinating discharge: 31 mins  SIGNED:  Irwin Brakeman, MD  [facility] 05/31/2017, 9:38 AM Pager [phone]  If 7PM-7AM, please contact night-coverage [email or web address] Password [email or web address]

## 2017-05-31 NOTE — Progress Notes (Addendum)
Iv removed, tolerated well. Report called to [facility]. EMS as been called for transport. Daughter is aware, daughter at bedside.

## 2017-06-13 DEATH — deceased

## 2019-05-05 IMAGING — CT CT HEAD W/O CM
3 series · 16 of 47 positions shown, 19 images · non-contrast
Comparison: None.

CLINICAL DATA: 83-year-old female with generalized weakness and
decreased appetite for the past 3 weeks. Prior stroke with residual
right-sided weakness. Initial encounter.

EXAM:
CT HEAD WITHOUT CONTRAST
TECHNIQUE: Contiguous axial images were obtained from the base of the skull
through the vertex without intravenous contrast.

[Series 2: head trauma wo · axial · 0.45mm/px · z∈[-611,-476]mm · 10 of 33 slices shown, 13 images]
[im 3/33  brain]
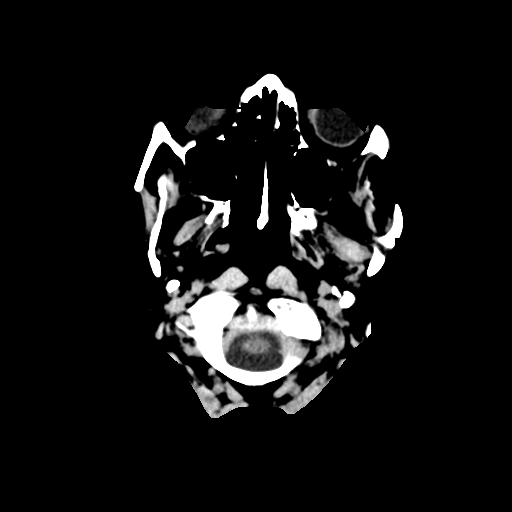
[im 3/33  bone]
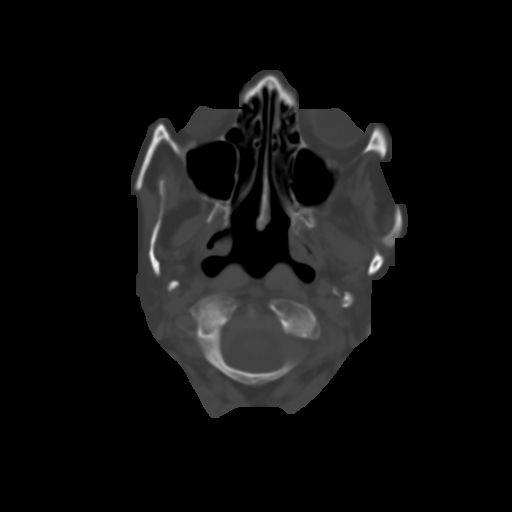
[im 6/33  brain]
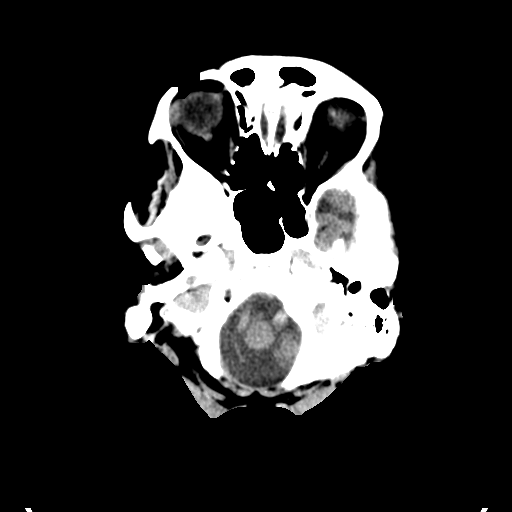
[im 9/33  brain]
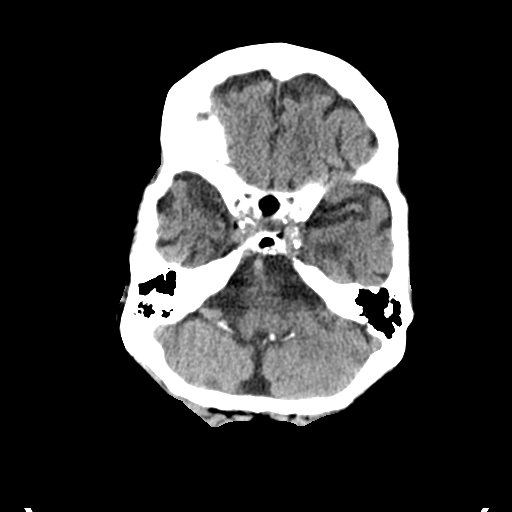
[im 12/33  brain]
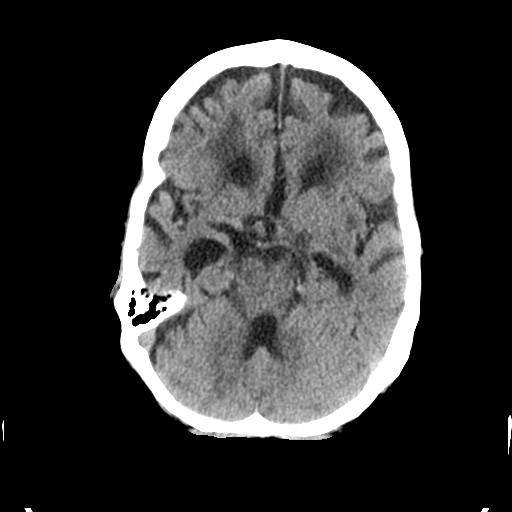
[im 15/33  brain]
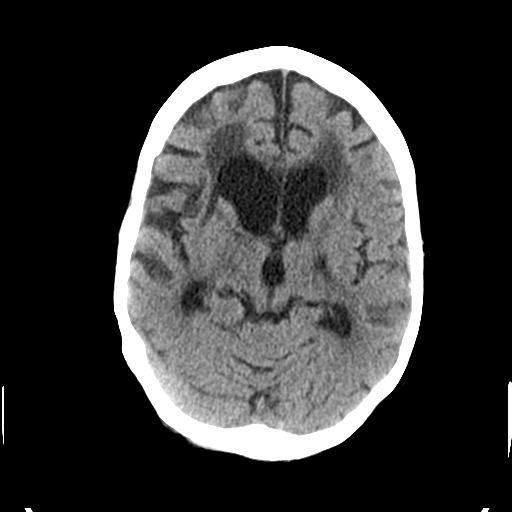
[im 15/33  bone]
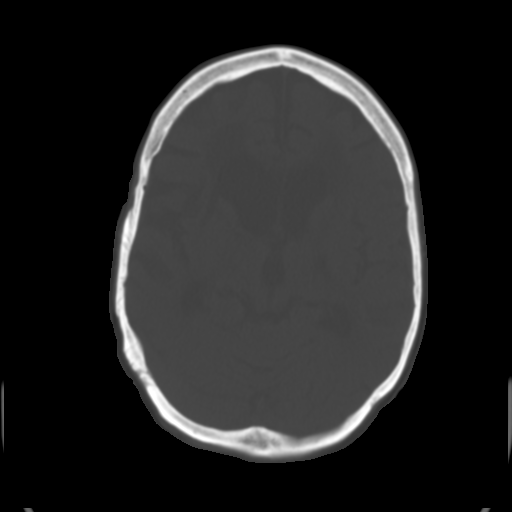
[im 18/33  brain]
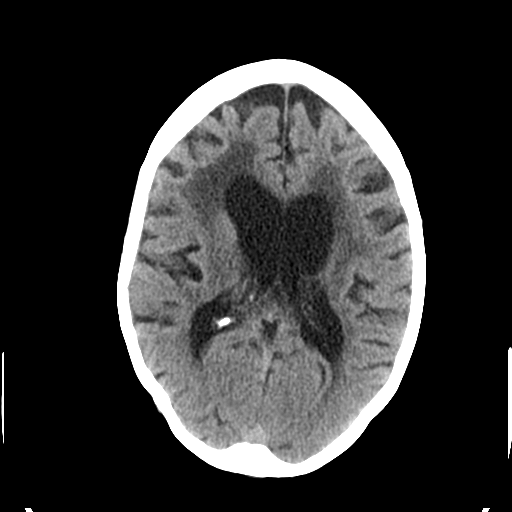
[im 21/33  brain]
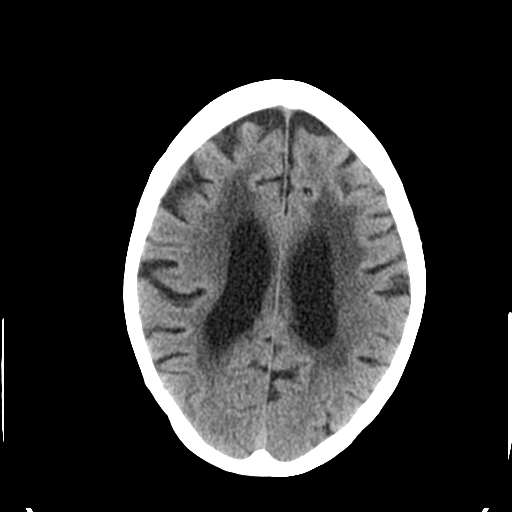
[im 25/33  brain]
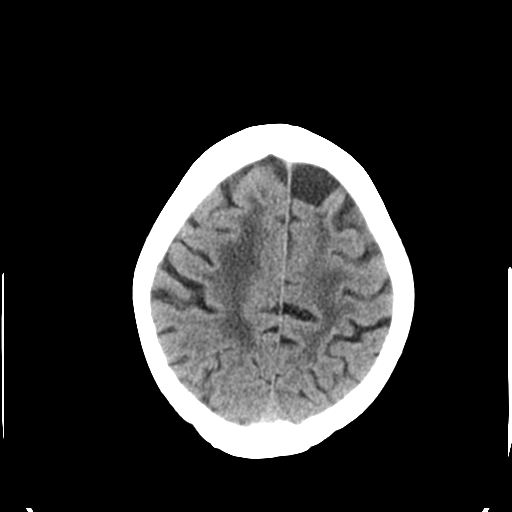
[im 27/33  brain]
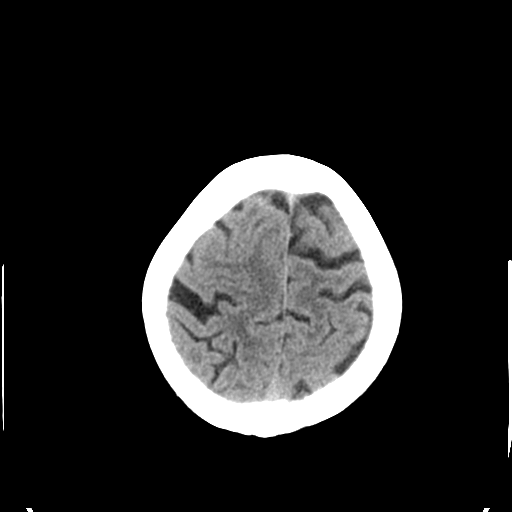
[im 27/33  bone]
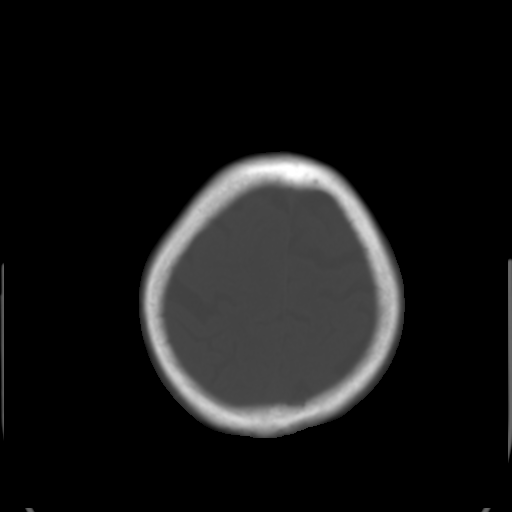
[im 30/33  brain]
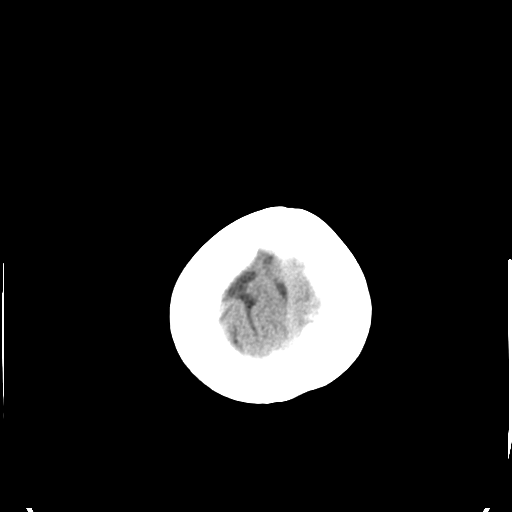

[Series 4: coronal soft tissue · coronal · 0.37mm/px · 3 of 70 slices shown]
[im 24/70  brain]
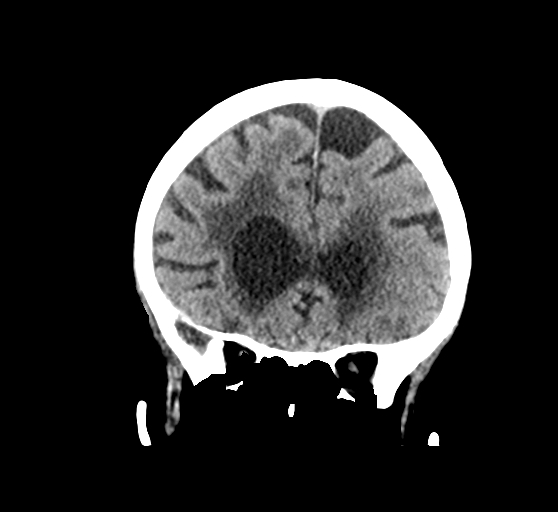
[im 31/70  brain]
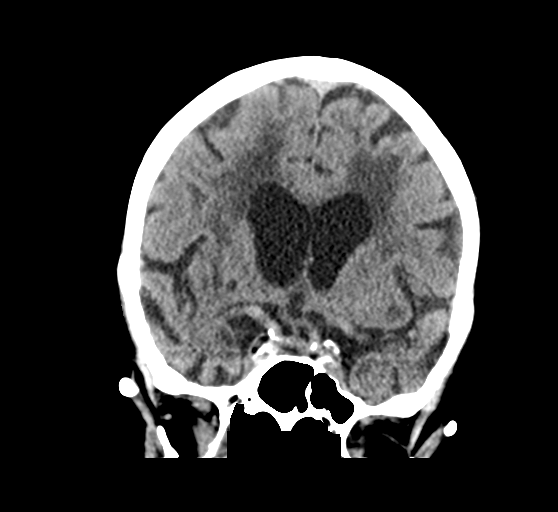
[im 39/70  brain]
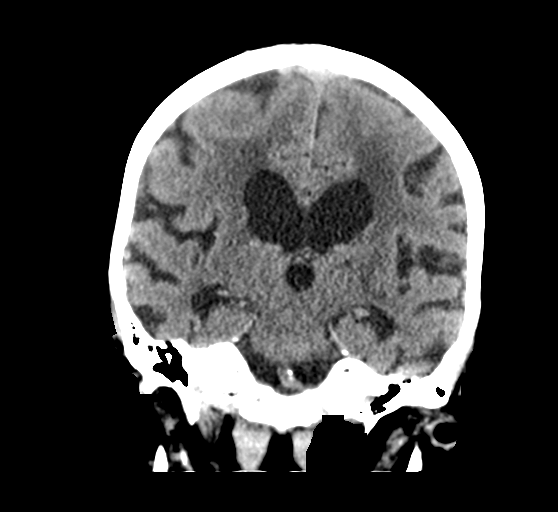

[Series 5: sagittal soft tissue · sagittal · 0.37mm/px · 3 of 59 slices shown]
[im 20/59  brain]
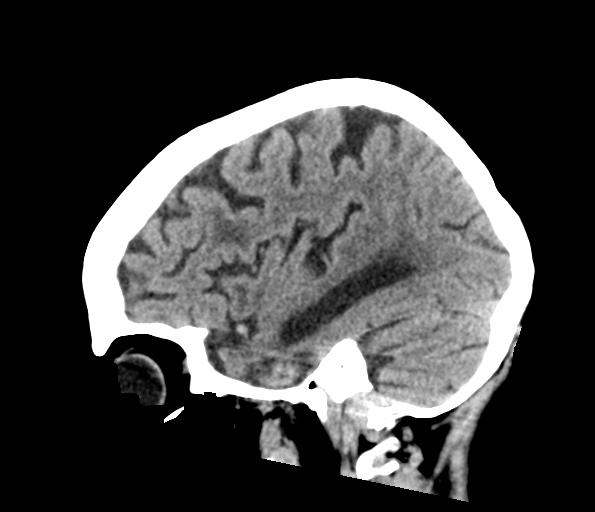
[im 30/59  brain]
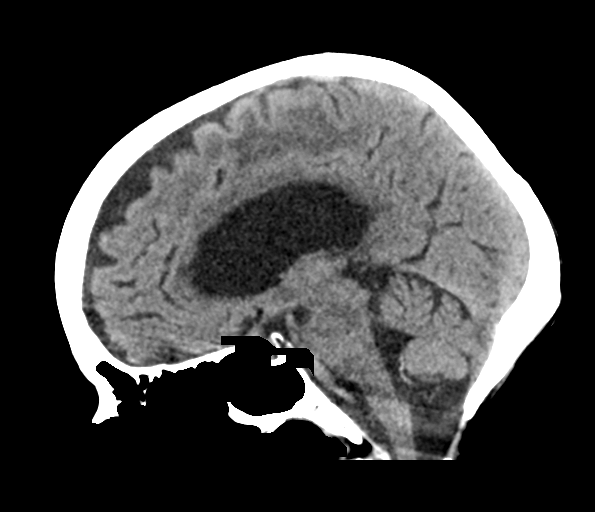
[im 39/59  brain]
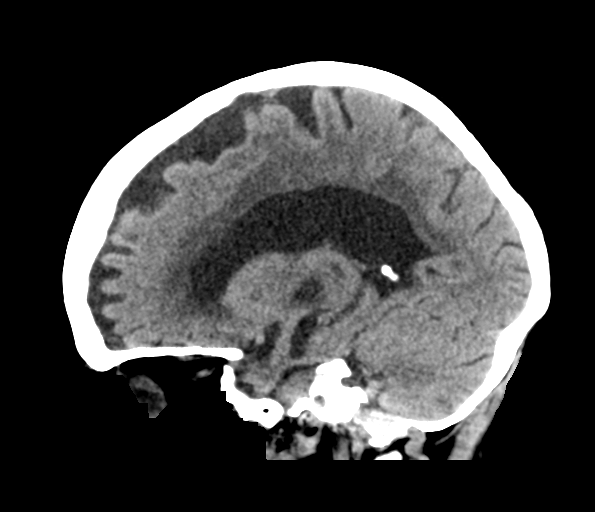

[16 of 47 positions shown; findings below may reference images not displayed]

FINDINGS: Brain: No intracranial hemorrhage or CT evidence of large acute
infarct.

Remote infarct posterior left internal capsule, right external
capsule and inferior right cerebellum.

Prominent chronic microvascular changes.

Global atrophy.

No intracranial mass lesion noted on this unenhanced exam.

Vascular: Vascular calcifications

Skull: No acute abnormality.

Sinuses/Orbits: Visualized orbital structures without acute
abnormality. Visualized paranasal sinuses clear.

Other: Mastoid air cells and middle ear cavities are clear.
IMPRESSION: No intracranial hemorrhage or CT evidence of large acute infarct.

Remote infarct posterior left internal capsule, right external
capsule and inferior right cerebellum.

Significant chronic microvascular changes.

Atrophy.
